# Patient Record
Sex: Female | Born: 1950 | Race: Black or African American | Hispanic: No | Marital: Single | Smoking: Former smoker
Health system: Southern US, Community
[De-identification: ages and names within clinical notes are randomized; demographics above are authoritative.]

## PROBLEM LIST (undated history)

## (undated) DIAGNOSIS — E785 Hyperlipidemia, unspecified: Secondary | ICD-10-CM

## (undated) DIAGNOSIS — I10 Essential (primary) hypertension: Secondary | ICD-10-CM

## (undated) HISTORY — DX: Hyperlipidemia, unspecified: E78.5

## (undated) HISTORY — PX: CATARACT EXTRACTION: SUR2

## (undated) HISTORY — DX: Essential (primary) hypertension: I10

---

## 2000-03-10 ENCOUNTER — Emergency Department (HOSPITAL_COMMUNITY): Admission: EM | Admit: 2000-03-10 | Discharge: 2000-03-10 | Payer: Self-pay | Admitting: Emergency Medicine

## 2001-01-20 ENCOUNTER — Emergency Department (HOSPITAL_COMMUNITY): Admission: EM | Admit: 2001-01-20 | Discharge: 2001-01-20 | Payer: Self-pay | Admitting: Emergency Medicine

## 2002-12-07 ENCOUNTER — Encounter: Payer: Self-pay | Admitting: Allergy and Immunology

## 2002-12-07 ENCOUNTER — Encounter: Admission: RE | Admit: 2002-12-07 | Discharge: 2002-12-07 | Payer: Self-pay | Admitting: *Deleted

## 2007-05-04 ENCOUNTER — Ambulatory Visit: Payer: Self-pay | Admitting: Gastroenterology

## 2008-11-10 ENCOUNTER — Emergency Department: Payer: Self-pay | Admitting: Unknown Physician Specialty

## 2008-11-15 ENCOUNTER — Emergency Department: Payer: Self-pay | Admitting: Internal Medicine

## 2009-12-18 DIAGNOSIS — J309 Allergic rhinitis, unspecified: Secondary | ICD-10-CM | POA: Insufficient documentation

## 2010-08-14 IMAGING — US US EXTREM LOW VENOUS BILAT
1 series · 17 of 24 positions shown · non-contrast
Comparison: none

REASON FOR EXAM: leg pain
COMMENTS:

[Series 1: us extrem low venous bilat · 17 of 42 slices shown]
[im 1/42]
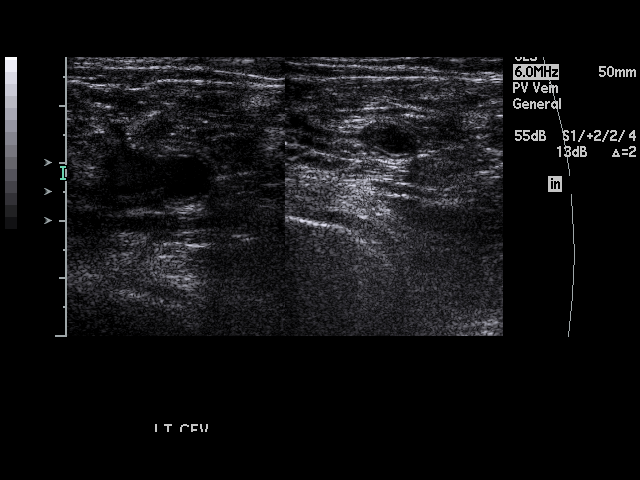
[im 4/42]
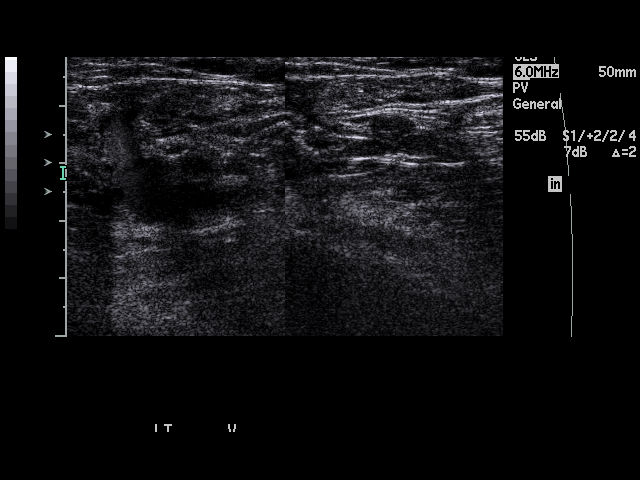
[im 6/42]
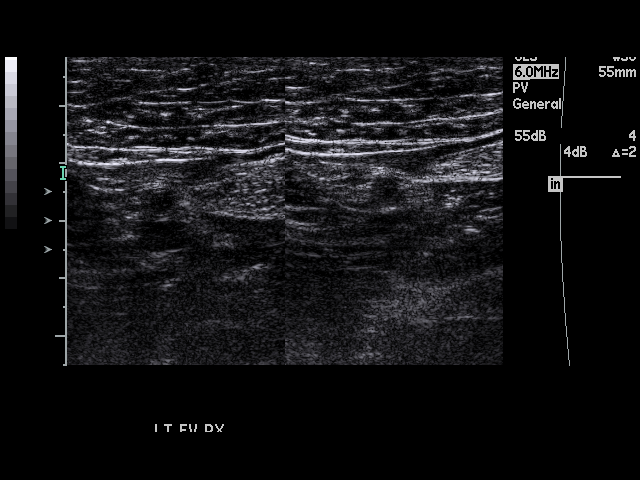
[im 8/42]
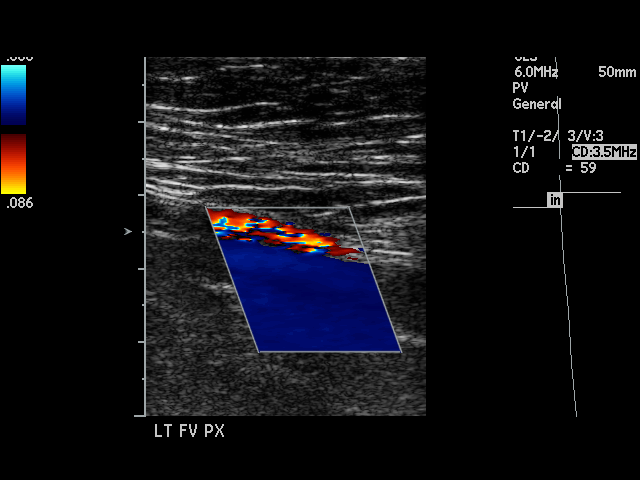
[im 11/42]
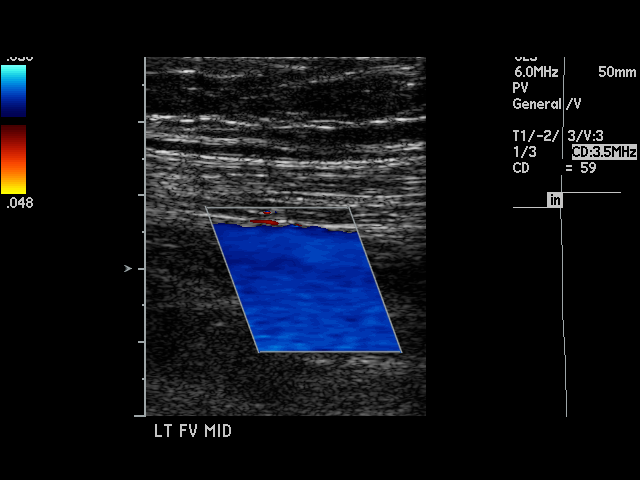
[im 13/42]
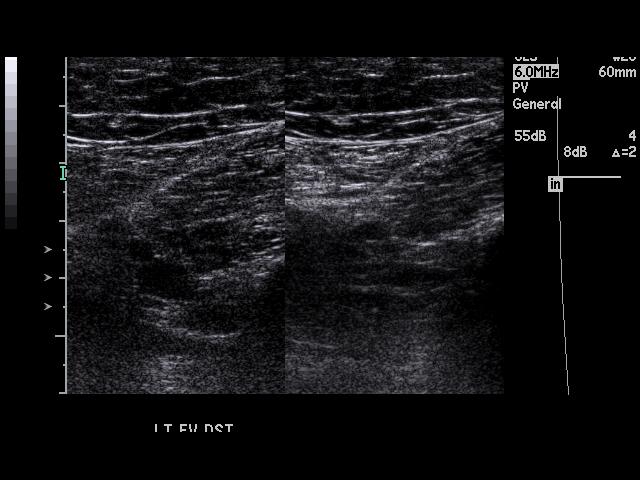
[im 17/42]
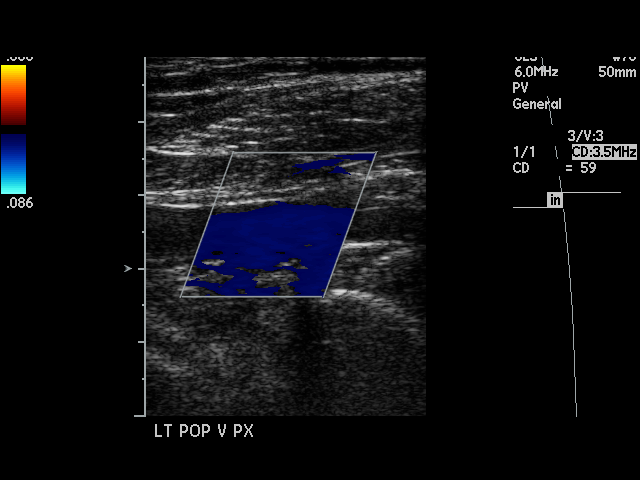
[im 18/42]
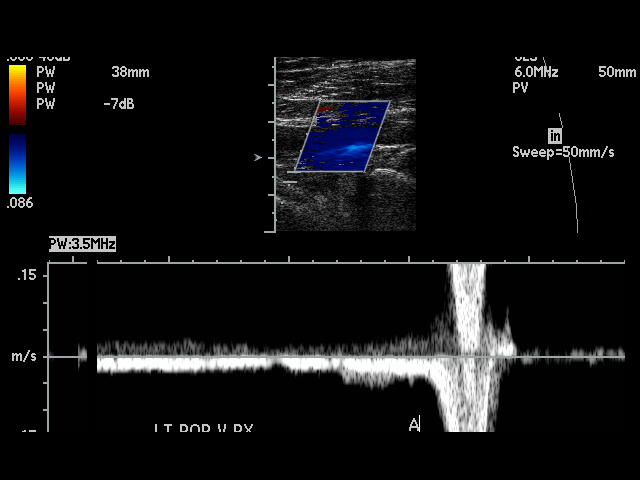
[im 22/42]
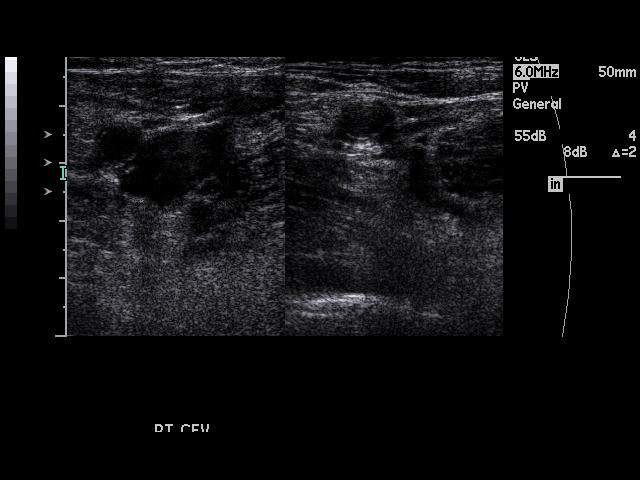
[im 24/42]
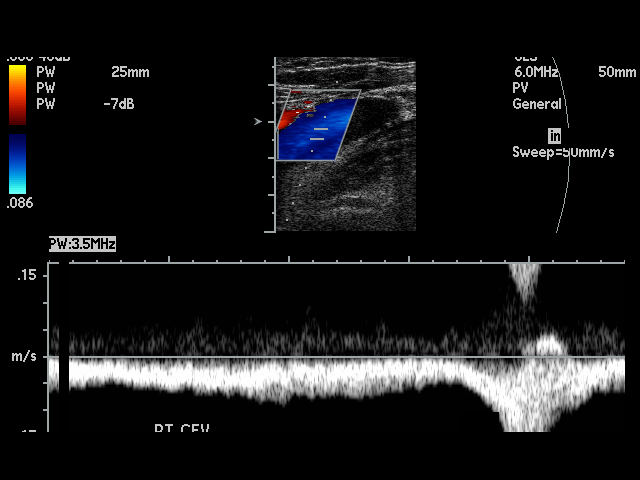
[im 25/42]
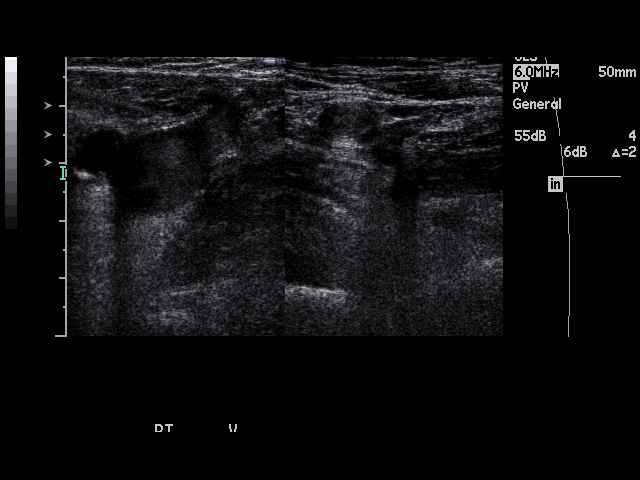
[im 29/42]
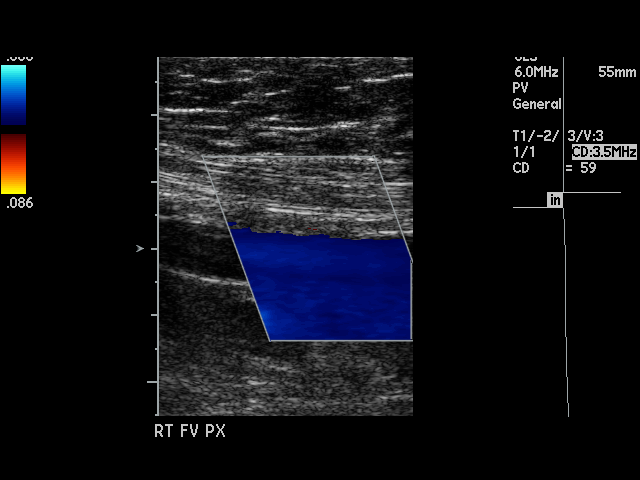
[im 31/42]
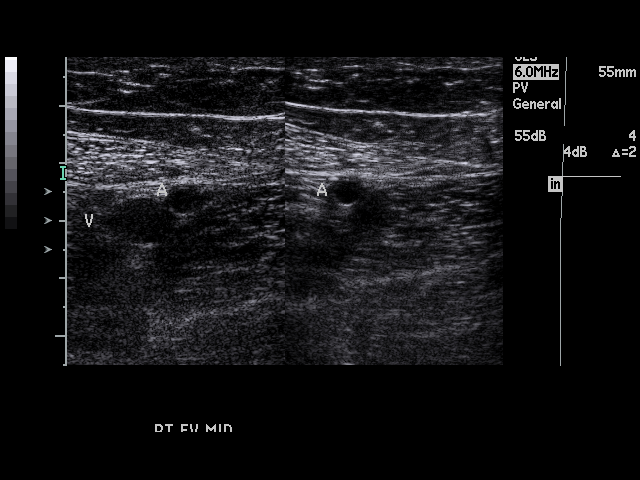
[im 34/42]
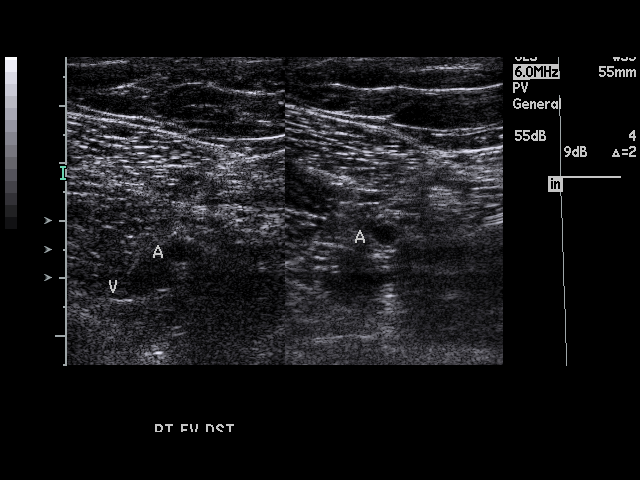
[im 36/42]
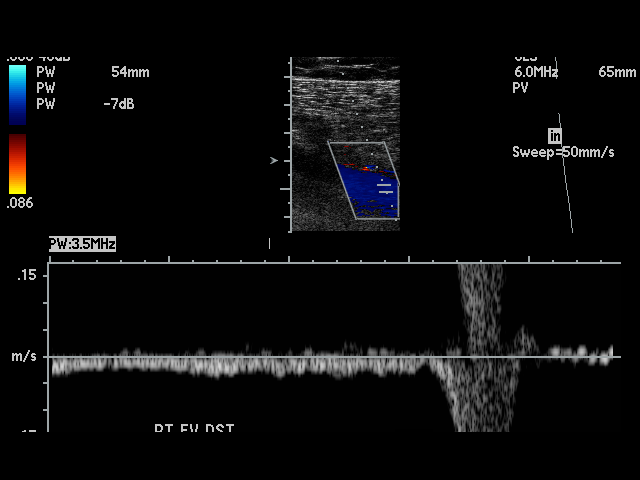
[im 38/42]
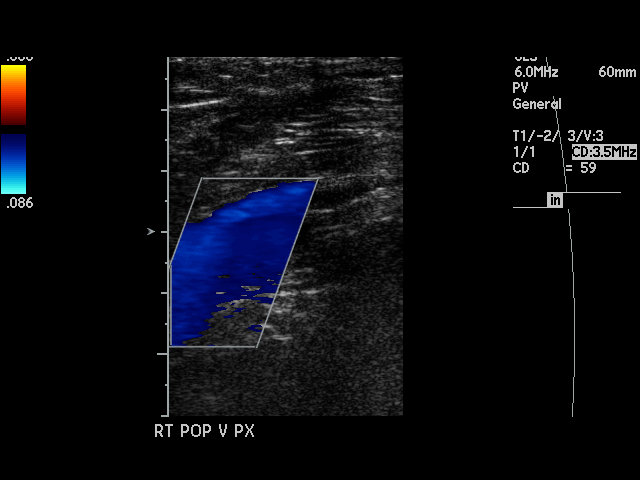
[im 42/42]
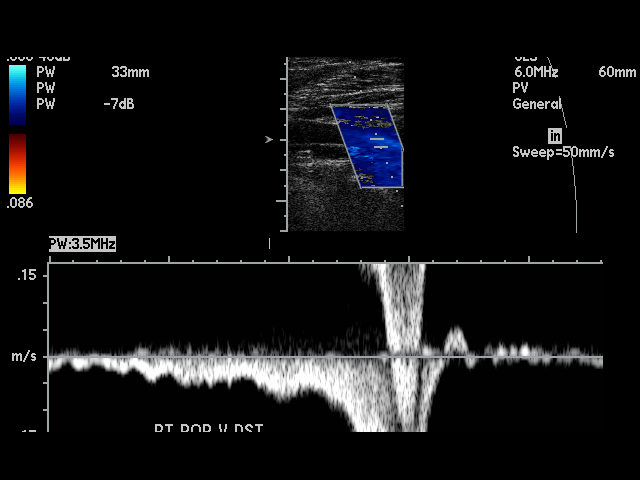

[17 of 24 positions shown; findings below may reference images not displayed]

PROCEDURE:     US  - US DOPPLER LOW EXTR BILATERAL  - November 15, 2008  [DATE]

RESULT:     The augmentation flow waveforms are normal in appearance
bilaterally. The femoral and popliteal veins bilaterally show complete
compressibility throughout their course. Bilateral Doppler examination shows
no occlusion or evidence of deep vein thrombosis.
IMPRESSION: No deep vein thrombosis is identified on either side.

## 2011-02-12 ENCOUNTER — Telehealth: Payer: Self-pay | Admitting: Internal Medicine

## 2011-02-14 NOTE — Telephone Encounter (Signed)
Opened in error

## 2012-02-29 DIAGNOSIS — E7800 Pure hypercholesterolemia, unspecified: Secondary | ICD-10-CM | POA: Insufficient documentation

## 2012-10-31 ENCOUNTER — Emergency Department: Payer: Self-pay | Admitting: Unknown Physician Specialty

## 2012-10-31 LAB — MAGNESIUM: Magnesium: 2 mg/dL

## 2012-10-31 LAB — CBC
HCT: 34.9 % — ABNORMAL LOW (ref 35.0–47.0)
MCH: 28 pg (ref 26.0–34.0)
MCHC: 34.9 g/dL (ref 32.0–36.0)
MCV: 80 fL (ref 80–100)
RBC: 4.34 10*6/uL (ref 3.80–5.20)
RDW: 15.1 % — ABNORMAL HIGH (ref 11.5–14.5)
WBC: 5.1 10*3/uL (ref 3.6–11.0)

## 2012-10-31 LAB — COMPREHENSIVE METABOLIC PANEL
Albumin: 3.8 g/dL (ref 3.4–5.0)
Anion Gap: 8 (ref 7–16)
BUN: 9 mg/dL (ref 7–18)
Co2: 26 mmol/L (ref 21–32)
Creatinine: 0.87 mg/dL (ref 0.60–1.30)
Potassium: 3.5 mmol/L (ref 3.5–5.1)
SGOT(AST): 16 U/L (ref 15–37)
Total Protein: 7.8 g/dL (ref 6.4–8.2)

## 2012-10-31 LAB — PROTIME-INR: INR: 1

## 2012-10-31 LAB — APTT: Activated PTT: 33.8 secs (ref 23.6–35.9)

## 2013-04-10 DIAGNOSIS — Z87891 Personal history of nicotine dependence: Secondary | ICD-10-CM | POA: Insufficient documentation

## 2014-01-06 LAB — HM HEPATITIS C SCREENING LAB: HM Hepatitis Screen: NEGATIVE

## 2014-01-06 LAB — HM HIV SCREENING LAB: HM HIV Screening: NEGATIVE

## 2015-10-10 DIAGNOSIS — G8929 Other chronic pain: Secondary | ICD-10-CM | POA: Insufficient documentation

## 2019-10-08 LAB — HM DEXA SCAN: HM Dexa Scan: NORMAL

## 2019-10-19 ENCOUNTER — Ambulatory Visit (INDEPENDENT_AMBULATORY_CARE_PROVIDER_SITE_OTHER): Payer: Medicare Other

## 2019-10-19 ENCOUNTER — Ambulatory Visit: Payer: Medicare Other | Admitting: Podiatry

## 2019-10-19 ENCOUNTER — Other Ambulatory Visit: Payer: Self-pay

## 2019-10-19 DIAGNOSIS — L989 Disorder of the skin and subcutaneous tissue, unspecified: Secondary | ICD-10-CM

## 2019-10-19 DIAGNOSIS — M25571 Pain in right ankle and joints of right foot: Secondary | ICD-10-CM

## 2019-10-19 DIAGNOSIS — Q828 Other specified congenital malformations of skin: Secondary | ICD-10-CM | POA: Diagnosis not present

## 2019-10-20 ENCOUNTER — Encounter: Payer: Self-pay | Admitting: Podiatry

## 2019-10-20 NOTE — Progress Notes (Addendum)
Subjective:  Patient ID: Hannah Barnett, female    DOB: [birth year],  MRN: [id number]  Chief Complaint  Patient presents with  . Ankle Pain    pt is here for right ankle pain, as well as left foot pain, pt states that she is looking to have both feet evaluated.    69 y.o. female presents with the above complaint.  Patient presents with complaint of bilateral hyperkeratotic lesions due to biomechanical imbalances to bilateral hallux IPJ as well as left subfifth metatarsal benign skin lesion.  Patient states that they have been very painful.  Patient has a flatfoot structure that seems to be the primary cause of it.  She states been going for 6 months pain is often varies.  Painful when walking painful when this first thing in the morning.  She wears comfortable seek sneakers but has not made any shoe gear modifications.  She denies any other acute complaints have been developing for past 10 years and has progressively gotten worse.  She would like to know whether discuss treatment options.  She has not seen anyone else prior to seeing me.  She denies any other acute complaints.   Review of Systems: Negative except as noted in the HPI. Denies N/V/F/Ch.  History reviewed. No pertinent past medical history.  Current Outpatient Medications:  .  aspirin 81 MG EC tablet, Take by mouth., Disp: , Rfl:  .  atorvastatin (LIPITOR) 40 MG tablet, Take 40 mg by mouth daily., Disp: , Rfl:  .  Cholecalciferol 50 MCG (2000 UT) CAPS, Take by mouth., Disp: , Rfl:  .  fluticasone (FLONASE) 50 MCG/ACT nasal spray, 1 spray by Each Nare route daily., Disp: , Rfl:  .  Multiple Vitamin (DAILY VITAMINS) tablet, Take 1 tablet by mouth daily., Disp: , Rfl:  .  nicotine (NICODERM CQ - DOSED IN MG/24 HOURS) 21 mg/24hr patch, Place onto the skin., Disp: , Rfl:  .  vitamin B-12 (CYANOCOBALAMIN) 500 MCG tablet, Take by mouth., Disp: , Rfl:   Social History   Tobacco Use  Smoking Status Not on file    Allergies    Allergen Reactions  . Atorvastatin Other (See Comments)    Severe myalgia   Objective:  There were no vitals filed for this visit. There is no height or weight on file to calculate BMI. Constitutional Well developed. Well nourished.  Vascular Dorsalis pedis pulses palpable bilaterally. Posterior tibial pulses palpable bilaterally. Capillary refill normal to all digits.  No cyanosis or clubbing noted. Pedal hair growth normal.  Neurologic Normal speech. Oriented to person, place, and time. Epicritic sensation to light touch grossly present bilaterally.  Dermatologic Nails well groomed and normal in appearance. No open wounds.  Orthopedic:  Hyperkeratotic lesion noted at hallux IPJ bilateral as well as left submetatarsal 5 porokeratosis with central nucleated core noted.  Upon debridement of the lesion no pinpoint bleeding noted.  Gait examination shows severe pes planovalgus foot structure with calcaneal eversion to many toe sign and unable to recreate the arch with dorsiflexion of the hallux.   Radiographs: 3 views of skeletally mature adult bilateral feet: There is decreasing calcaneal inclination angle mild anterior break in the cyma line there is increase in talar declination angle first ray elevatus noted.  No bunion deformities identified ankle mortise within normal limits.  No osseous fractures noted. Assessment:   1. Right ankle pain, unspecified chronicity   2. Porokeratosis   3. Benign skin lesion    Plan:  Patient was evaluated  and treated and all questions answered.  Bilateral benign skin lesion/porokeratosis at hallux IPJ and left submetatarsal 5 -I explained the patient the etiology of porokeratosis of wrist treatment options were extensively discussed with the patient.  Primarily the reason why she is having a lot of pain associated with it is due to biomechanical imbalances with underlying pes planovalgus deformity.  I discussed with the patient extensive details.  I  believe patient will benefit from aggressive debridement of the lesion with excision of the central nucleated core from left submetatarsal 5.  Patient states understanding like to proceed with the debridement. -Using chisel blade on handle, hyperkeratotic lesion/benign skin lesion were aggressively debrided down to healthy striated tissue.  Central nucleated core excised at left submetatarsal 5.  No complications noted.  No pinpoint bleeding noted.  Pain relief upon debridement.   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  Ultimately the reason why she is developing the above lesions is due to the biomechanical imbalances.  I believe patient will benefit from offloading these lesions as well as obtaining custom-made orthotics to help control the hindfoot motion as well as take the pressure off the arch. -Should be scheduled see rick for custom-made orthotics.   Return in about 3 months (around 01/19/2020) for See Raiford Noble for orthotics ASAP.

## 2019-11-10 ENCOUNTER — Other Ambulatory Visit: Payer: Self-pay

## 2019-11-10 ENCOUNTER — Ambulatory Visit: Payer: Medicare Other | Admitting: Orthotics

## 2019-11-10 DIAGNOSIS — Q828 Other specified congenital malformations of skin: Secondary | ICD-10-CM

## 2019-11-10 DIAGNOSIS — M25571 Pain in right ankle and joints of right foot: Secondary | ICD-10-CM

## 2019-11-11 ENCOUNTER — Other Ambulatory Visit: Payer: Self-pay | Admitting: Podiatry

## 2019-11-11 DIAGNOSIS — L989 Disorder of the skin and subcutaneous tissue, unspecified: Secondary | ICD-10-CM

## 2019-12-03 DIAGNOSIS — I10 Essential (primary) hypertension: Secondary | ICD-10-CM | POA: Insufficient documentation

## 2019-12-14 ENCOUNTER — Telehealth: Payer: Self-pay | Admitting: Podiatry

## 2019-12-14 NOTE — Telephone Encounter (Signed)
Pt left message  @ 233pm upset because she has been trying to get someone all day. She said someone left message that her things(I am guessing orthotics) are in she can pick them up and she lives in [place] and is not driving all the way up there until she talks with someone. Can someone please call her back asap.

## 2019-12-15 NOTE — Telephone Encounter (Signed)
Pt called today, extremely upset. Pt has left multiple messages with the [facility] office. Pt stated she did receive a call, but once she called back no one would answer the phone. Pt wanted to know the status of her orthotics are they ready for pick up and what taking them so long to arrive. Please advise.

## 2019-12-15 NOTE — Progress Notes (Signed)
Patient cast today for CMFO to address RT ankle pain, keratomas

## 2019-12-16 NOTE — Telephone Encounter (Signed)
Patient has been notified via voice mail that orthotics are in [place] office and she can pick up anytime during office hours.

## 2019-12-17 ENCOUNTER — Telehealth: Payer: Self-pay | Admitting: Podiatry

## 2019-12-17 NOTE — Telephone Encounter (Signed)
Pt left message @ 1207 today asking what she needed to bring to pick up her orthotics.   I returned the call and we discussed what to bring. I recommended tennis shoes and also recommended she have an appt with Raiford Noble to pick up since she is traveling to come get them so far. I did say that she could of picked them up but if any issues with them she would have to schedule an appt to see Raiford Noble.. I explained that especially since she has never had them that Raiford Noble needs to see her to make sure they fit in her shoes and fit her feet properly and if not he can adjust if needed at this appt. Pt was very appreciative of the call.

## 2019-12-28 ENCOUNTER — Telehealth: Payer: Self-pay | Admitting: Podiatry

## 2019-12-28 NOTE — Telephone Encounter (Signed)
Pt left message Friday 9.3 @ 244pm with her name and stated she has an appt 9.8 but then the phone disconnected.   I called back and pt did not answer. She is scheduled to be seen Wednesday in [place] to puo.

## 2019-12-29 ENCOUNTER — Encounter: Payer: Medicare Other | Admitting: Orthotics

## 2020-01-20 ENCOUNTER — Ambulatory Visit (INDEPENDENT_AMBULATORY_CARE_PROVIDER_SITE_OTHER): Payer: Medicare Other | Admitting: Podiatry

## 2020-01-20 ENCOUNTER — Encounter: Payer: Self-pay | Admitting: Podiatry

## 2020-01-20 ENCOUNTER — Other Ambulatory Visit: Payer: Self-pay

## 2020-01-20 DIAGNOSIS — Q666 Other congenital valgus deformities of feet: Secondary | ICD-10-CM

## 2020-01-20 DIAGNOSIS — Q828 Other specified congenital malformations of skin: Secondary | ICD-10-CM | POA: Diagnosis not present

## 2020-01-20 NOTE — Patient Instructions (Signed)

## 2020-01-20 NOTE — Progress Notes (Signed)
Subjective:  Patient ID: Hannah Barnett, female    DOB: 01-28-1951,  MRN: [id number]  Chief Complaint  Patient presents with  . Callouses    "the thick skin is hurting my feet again"   She is also here to pick up orthotics  . Foot Orthotics    69 y.o. female presents with the above complaint.  Patient presents with follow-up of bilateral hyperkeratotic lesion due to mechanical bows to the hallux IPJ as well as left submetatarsal 5.  Patient states that there is still continues to be painful.  She has not been able to appropriately offload on.  She is on her foot during work 3 times a week.  The splint is for the most painful.  She denies any other acute complaints.  She would like to discuss other treatment options.  She is here to pick up orthotics today which were dispensed.  Review of Systems: Negative except as noted in the HPI. Denies N/V/F/Ch.  No past medical history on file.  Current Outpatient Medications:  .  aspirin 81 MG EC tablet, Take by mouth., Disp: , Rfl:  .  atorvastatin (LIPITOR) 40 MG tablet, Take 40 mg by mouth daily., Disp: , Rfl:  .  Cholecalciferol 50 MCG (2000 UT) CAPS, Take by mouth., Disp: , Rfl:  .  fluticasone (FLONASE) 50 MCG/ACT nasal spray, 1 spray by Each Nare route daily., Disp: , Rfl:  .  Multiple Vitamin (DAILY VITAMINS) tablet, Take 1 tablet by mouth daily., Disp: , Rfl:  .  nicotine (NICODERM CQ - DOSED IN MG/24 HOURS) 21 mg/24hr patch, Place onto the skin., Disp: , Rfl:  .  vitamin B-12 (CYANOCOBALAMIN) 500 MCG tablet, Take by mouth., Disp: , Rfl:   Social History   Tobacco Use  Smoking Status Not on file    Allergies  Allergen Reactions  . Atorvastatin Other (See Comments)    Severe myalgia   Objective:  There were no vitals filed for this visit. There is no height or weight on file to calculate BMI. Constitutional Well developed. Well nourished.  Vascular Dorsalis pedis pulses palpable bilaterally. Posterior tibial pulses palpable  bilaterally. Capillary refill normal to all digits.  No cyanosis or clubbing noted. Pedal hair growth normal.  Neurologic Normal speech. Oriented to person, place, and time. Epicritic sensation to light touch grossly present bilaterally.  Dermatologic Nails well groomed and normal in appearance. No open wounds.  Orthopedic:  Hyperkeratotic lesion noted at hallux IPJ bilateral as well as left submetatarsal 5 porokeratosis with central nucleated core noted.  Upon debridement of the lesion no pinpoint bleeding noted.  Gait examination shows severe pes planovalgus foot structure with calcaneal eversion to many toe sign and unable to recreate the arch with dorsiflexion of the hallux.   Radiographs: 3 views of skeletally mature adult bilateral feet: There is decreasing calcaneal inclination angle mild anterior break in the cyma line there is increase in talar declination angle first ray elevatus noted.  No bunion deformities identified ankle mortise within normal limits.  No osseous fractures noted. Assessment:   1. Porokeratosis   2. Pes planovalgus    Plan:  Patient was evaluated and treated and all questions answered.  Bilateral benign skin lesion/porokeratosis at hallux IPJ and left submetatarsal 5 -I explained the patient the etiology of porokeratosis of wrist treatment options were extensively discussed with the patient.  Primarily the reason why she is having a lot of pain associated with it is due to biomechanical imbalances with underlying  pes planovalgus deformity.  I discussed with the patient extensive details.  I believe patient will benefit from continued aggressive debridement of the lesion with excision of the central nucleated core from left submetatarsal 5.  Patient states understanding like to proceed with the debridement. -Using chisel blade on handle, hyperkeratotic lesion/benign skin lesion were aggressively debrided down to healthy striated tissue.  Central nucleated core  excised at left submetatarsal 5.  No complications noted.  No pinpoint bleeding noted.  Pain relief upon debridement.   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  Ultimately the reason why she is developing the above lesions is due to the biomechanical imbalances.  I believe patient will benefit from offloading these lesions as well as obtaining custom-made orthotics to help control the hindfoot motion as well as take the pressure off the arch. -Orthotics were picked up today.  They seem to be functioning well.  I encouraged her to continue utilizing it does take the stress off of the underlying porokeratosis.  Patient states understanding   No follow-ups on file.

## 2020-01-20 NOTE — Progress Notes (Signed)
Patient presents for orthotic pick up.  Verbal and written break in and wear instructions given.  Patient will follow up in 4 weeks with Dr if symptoms worsen or fail to improve. 

## 2020-02-25 ENCOUNTER — Telehealth: Payer: Self-pay | Admitting: Orthotics

## 2020-02-25 DIAGNOSIS — Q828 Other specified congenital malformations of skin: Secondary | ICD-10-CM

## 2020-02-25 NOTE — Telephone Encounter (Signed)
Returned call she left on Dawn's phone.  Her concern was the f/o causing discomfort.  Left a message advising her to make an adjustment appointment.

## 2020-03-21 ENCOUNTER — Ambulatory Visit: Payer: Medicare Other | Admitting: Podiatry

## 2020-03-22 ENCOUNTER — Other Ambulatory Visit: Payer: Self-pay

## 2020-03-22 ENCOUNTER — Ambulatory Visit: Payer: Medicare Other | Admitting: Orthotics

## 2020-03-22 DIAGNOSIS — Q666 Other congenital valgus deformities of feet: Secondary | ICD-10-CM

## 2020-03-22 DIAGNOSIS — Q828 Other specified congenital malformations of skin: Secondary | ICD-10-CM

## 2020-03-22 NOTE — Progress Notes (Signed)
Sending back to richy to adjust w/ k-wedge and softer device.

## 2020-03-30 ENCOUNTER — Ambulatory Visit: Payer: Medicare Other | Admitting: Podiatry

## 2020-04-12 ENCOUNTER — Other Ambulatory Visit: Payer: Self-pay

## 2020-04-12 ENCOUNTER — Ambulatory Visit: Payer: Medicare Other | Admitting: Orthotics

## 2020-04-12 ENCOUNTER — Encounter: Payer: Self-pay | Admitting: Podiatry

## 2020-04-12 ENCOUNTER — Ambulatory Visit (INDEPENDENT_AMBULATORY_CARE_PROVIDER_SITE_OTHER): Payer: Medicare Other | Admitting: Orthotics

## 2020-04-12 DIAGNOSIS — Q828 Other specified congenital malformations of skin: Secondary | ICD-10-CM

## 2020-04-12 DIAGNOSIS — Q666 Other congenital valgus deformities of feet: Secondary | ICD-10-CM

## 2020-04-12 NOTE — Progress Notes (Signed)
Patient f/o not in; will be mailed to her from [facility]

## 2020-04-26 ENCOUNTER — Ambulatory Visit: Payer: Medicare Other | Admitting: Orthotics

## 2020-05-24 ENCOUNTER — Telehealth: Payer: Self-pay | Admitting: Podiatry

## 2020-05-24 NOTE — Telephone Encounter (Signed)
Pt left voicemail demanding a call back because she got a reminder call that she had an appt in [place] office but was not coming to [facility]. She canceled that appt.  I returned call and she screamed WHAT upon answering the phone.I politely asked her not to scream at me that I was returning a call to her as she asked. I explained that her appt was cxled for the [place] office but that was an automated system from yesterday and did not update that the appt was cxled. She asked if we would call her if we had any appts sooner in [place] and I told her I had put her on the waitlist and if there qwas a cxlation for 2.16 they could call her to see if it worked for her.

## 2020-05-24 NOTE — Telephone Encounter (Signed)
Pt called stating she just got a text about her appt reminder and said she hopes it was not in [place] because she cannot come to [facility] office it is too far.  I offered her appt this afternoon in [place] at 315 but she could not come. She was then got upset because his next available appt was 3.2 in [place]. I told pt I would put her on the waitlist and if we got any cancellations on 2.16 someone would call to see if it would work for pt. She was not happy about having to wait. She stated she was not paying for the orthotics until they are correct. I told pt I would send a message to billing to see if they could put a hold on the account.

## 2020-05-29 ENCOUNTER — Other Ambulatory Visit: Payer: Medicare Other | Admitting: Orthotics

## 2020-06-06 ENCOUNTER — Telehealth: Payer: Self-pay | Admitting: Podiatry

## 2020-06-06 NOTE — Telephone Encounter (Signed)
Pt left message wanting a call back about an appt.   I returned call and left message for pt to call back.

## 2020-06-09 ENCOUNTER — Telehealth: Payer: Self-pay | Admitting: Podiatry

## 2020-06-09 NOTE — Telephone Encounter (Signed)
Pt left message stating she was waiting on a call back about an appt for the orthotics because they are not working for her.   I returned call and left message for pt that I see she does have an appt on 3.2.2022 with Raiford Noble in [place] because the orthotics are not working for her. I asked her to please call if any further questions.

## 2020-06-21 ENCOUNTER — Ambulatory Visit (INDEPENDENT_AMBULATORY_CARE_PROVIDER_SITE_OTHER): Payer: Medicare (Managed Care) | Admitting: Podiatry

## 2020-06-21 ENCOUNTER — Other Ambulatory Visit: Payer: Self-pay

## 2020-06-21 DIAGNOSIS — L989 Disorder of the skin and subcutaneous tissue, unspecified: Secondary | ICD-10-CM

## 2020-06-21 DIAGNOSIS — Q666 Other congenital valgus deformities of feet: Secondary | ICD-10-CM

## 2020-06-21 DIAGNOSIS — M25571 Pain in right ankle and joints of right foot: Secondary | ICD-10-CM

## 2020-06-21 NOTE — Progress Notes (Signed)
Patient presented today to be re-casted for orthotics.  A foam impression was casted for her right and left foot.  Patient is a size 9 to 9 1/2.  Patient stated today that the inserts are not soft enough and there was never any pockets on the left insert and the pain went up to my knees and that the right insert was ok due to having the pockets in the insert.  Patient was informed that it can take up to 4 weeks and we will call patient when the inserts come in.

## 2020-06-22 ENCOUNTER — Telehealth: Payer: Self-pay | Admitting: Podiatry

## 2020-06-22 NOTE — Telephone Encounter (Signed)
Pt called stating she was calling because the orthotics are having to be sent back again to be corrected and was told to call me about putting orthotic charges  on hold until she gets the orthotics back. I explained that the account is in the contested state and that was done last time we talked but that is all I am able to do as far has putting charges on hold. She said she was told by someone to tell me to call billing. I explained we have a billing specialist here and that she is the one that contacts billing dept but that putting in the contested state is all she can do.

## 2020-07-05 ENCOUNTER — Telehealth: Payer: Self-pay | Admitting: Podiatry

## 2020-07-05 NOTE — Telephone Encounter (Signed)
Pt left 2 messages this morning asking about status of her orthotics.   She then called again and I answered and I told pt I would call when they come in.  She is to bring in the old ones when she comes to pick up the new ones.

## 2020-07-19 ENCOUNTER — Other Ambulatory Visit: Payer: Medicare (Managed Care)

## 2020-07-20 ENCOUNTER — Telehealth: Payer: Self-pay | Admitting: Podiatry

## 2020-07-20 NOTE — Telephone Encounter (Signed)
Pt left message yesterday afternoon stating she wanted to know if we still had available appt on 4.6 in [place] office to puo...  I returned call and left message for pt to call to discuss further but I am not sure if I have anyone to dispense the orthotics on 4.6 in  but If interested I could send the orthotics there to be picked up on Monday 4.4 and she could try them and if needs adjustment or anything keep the appt with EJ on 4.13.

## 2020-07-21 ENCOUNTER — Telehealth: Payer: Self-pay | Admitting: Podiatry

## 2020-07-21 NOTE — Telephone Encounter (Signed)
Pt called stating she got my message and she wants to make sure the orthotics are in [place]. I told pt they are there that since she spoke to [place] the other day I thought she told pt orthotics were in [place]. Pt stated her car is in the shop but she should be able to pick them up on Monday.

## 2020-08-02 ENCOUNTER — Other Ambulatory Visit: Payer: Medicare (Managed Care)

## 2020-09-08 ENCOUNTER — Telehealth: Payer: Self-pay | Admitting: Podiatry

## 2020-09-08 NOTE — Telephone Encounter (Signed)
Pt called stating the orthotics are now hurting her right heel. She said she seen a lady and was told they were made wrong that they should have had a blue cover and was to be remade with the blue color.  I offered her an appt in [place] on 6.1 but she cannot do it in the am. I asked if she wanted to see EJ the pedorthist and she did not know who he was and cannot do the morning. I offered her appt on 7.13 in the afternoon with EJ and she said she did not want to wait that long especially since she is in pain. And they are wanting her to pay for the orthotics, Why would she pay when they are not working for pt. She id not happy with them at all. I offered her an appt with the doctor and she refused stating her insurance will not cover an appt.( I believe she is out of network) I also offered her an appt for 6.10 in [place] at 230 and she said who wants to be in [place] at that time of day. She said if she knew this was going to be such a problem she would not have gotten the orthotics to begin with. She finally agreed to get on the schedule for EJ on 6.10 and I put her on the waitlist for earlier in the day or a different day if anything becomes available. She said she was not going to pay any more for the orthotics until they work. I did explain that I was could not guarantee that she would see Misty Stanley as several of our assistance are helping in the orthotic dept. I apologized but gave her several options. This has been going on since last yr.

## 2020-09-13 ENCOUNTER — Telehealth: Payer: Self-pay | Admitting: Podiatry

## 2020-09-14 NOTE — Telephone Encounter (Signed)
"  Those inserts hurt my feet.  I can't wear them.  Something needs to be done."  Can you come in to see Dr. Allena Katz on Tuesday?  "I don't have any insurance and I can't afford to come there."  Do you want me to see if Dr. Allena Katz can prescribe you something?  "Yes, I don't know what to do about this mess.  These things are not working at all."  Where do your feet hurt at?  "They hurt on the bottom.  It's the heel on the right one too."

## 2020-09-19 MED ORDER — MELOXICAM 15 MG PO TABS
15.0000 mg | ORAL_TABLET | Freq: Every day | ORAL | 0 refills | Status: DC
Start: 2020-09-19 — End: 2022-08-15

## 2020-09-19 NOTE — Telephone Encounter (Signed)
Pt is calling back about having some pain meds prescribed for her foot. Please Advise

## 2020-09-19 NOTE — Telephone Encounter (Signed)
I am calling to let you know that Dr. Allena Katz has sent a prescription for Meloxicam to [facility].  He said he's going to cancel the charge for the orthotics.  He said if you need any further assistance, you can see someone else in the practice since you're not pleased with your service from him.  "Okay, thank you."

## 2020-09-19 NOTE — Telephone Encounter (Signed)
"  I still haven't heard anything about my medicine.  I called there last week.  If something don't happen soon, I'm going to sue your ass.  I shouldn't have to pay $400 for something I can't even use!  I'm not ever coming there again!"  I will send him your message.  He's here today.  You should hear something today.  "Okay, thank you!"

## 2020-09-19 NOTE — Addendum Note (Signed)
Addended by: Enedina Finner on: 09/19/2020 10:22 AM   Modules accepted: Orders

## 2020-09-29 ENCOUNTER — Other Ambulatory Visit: Payer: Medicare (Managed Care)

## 2021-03-05 LAB — HM COLONOSCOPY

## 2022-08-07 LAB — HM MAMMOGRAPHY

## 2022-08-15 ENCOUNTER — Ambulatory Visit (INDEPENDENT_AMBULATORY_CARE_PROVIDER_SITE_OTHER): Payer: 59 | Admitting: Plastic Surgery

## 2022-08-15 ENCOUNTER — Encounter: Payer: Self-pay | Admitting: Plastic Surgery

## 2022-08-15 VITALS — BP 117/73 | HR 73 | Ht 63.0 in | Wt 164.6 lb

## 2022-08-15 DIAGNOSIS — M542 Cervicalgia: Secondary | ICD-10-CM | POA: Diagnosis not present

## 2022-08-15 DIAGNOSIS — N62 Hypertrophy of breast: Secondary | ICD-10-CM | POA: Diagnosis not present

## 2022-08-15 DIAGNOSIS — M546 Pain in thoracic spine: Secondary | ICD-10-CM

## 2022-08-15 DIAGNOSIS — Z6829 Body mass index (BMI) 29.0-29.9, adult: Secondary | ICD-10-CM

## 2022-08-15 NOTE — Progress Notes (Signed)
Referring Provider No referring provider defined for this encounter.   CC:  Chief Complaint  Patient presents with   Advice Only      Hannah Barnett is an 72 y.o. female.  HPI: Hannah Barnett 72 year old female who presents today with complaints of upper back and neck pain secondary to the size of her breast.This has been present for most of her life.  She finds it difficult to find bras that fit appropriately due to the asymmetry of her breast and also due to the large size.  She is requesting a breast reduction.  Allergies  Allergen Reactions   Atorvastatin Other (See Comments)    Severe myalgia    Outpatient Encounter Medications as of 08/15/2022  Medication Sig   amLODipine (NORVASC) 2.5 MG tablet Take 2.5 mg by mouth daily.   BESIVANCE 0.6 % SUSP Place 1 drop into the left eye 3 (three) times daily.   Cholecalciferol 50 MCG (2000 UT) CAPS Take by mouth.   prednisoLONE acetate (PRED FORTE) 1 % ophthalmic suspension SMARTSIG:In Eye(s)   PROLENSA 0.07 % SOLN Place 1 drop into the left eye at bedtime.   rosuvastatin (CRESTOR) 10 MG tablet Take 10 mg by mouth at bedtime.   triamcinolone (NASACORT) 55 MCG/ACT AERO nasal inhaler SMARTSIG:Both Nares   [DISCONTINUED] aspirin 81 MG EC tablet Take by mouth.   [DISCONTINUED] atorvastatin (LIPITOR) 40 MG tablet Take 40 mg by mouth daily.   [DISCONTINUED] fluticasone (FLONASE) 50 MCG/ACT nasal spray 1 spray by Each Nare route daily.   [DISCONTINUED] hydrochlorothiazide (HYDRODIURIL) 12.5 MG tablet Take 12.5 mg by mouth daily.   [DISCONTINUED] meloxicam (MOBIC) 15 MG tablet Take 1 tablet (15 mg total) by mouth daily.   [DISCONTINUED] Multiple Vitamin (DAILY VITAMINS) tablet Take 1 tablet by mouth daily.   [DISCONTINUED] nicotine (NICODERM CQ - DOSED IN MG/24 HOURS) 21 mg/24hr patch Place onto the skin.   [DISCONTINUED] vitamin B-12 (CYANOCOBALAMIN) 500 MCG tablet Take by mouth.   No facility-administered encounter medications on file as  of 08/15/2022.     No past medical history on file.  No past surgical history on file.  No family history on file.  Social History   Social History Narrative   Not on file     Review of Systems General: Denies fevers, chills, weight loss CV: Denies chest pain, shortness of breath, palpitations Breast: Patient denies any particular breast complaints other than the large size.  She did note a small area of discoloration on the right nipple which she says just recently occurred and seems to be getting smaller.  There is no associated pain or drainage with this.  Physical Exam    08/15/2022    9:43 AM  Vitals with BMI  Height   Weight 164 lbs 10 oz  BMI 29.16  Systolic 117  Diastolic 73  Pulse 73    General:  No acute distress,  Alert and oriented, Non-Toxic, Normal speech and affect Breast: Patient has large breasts with moderate asymmetry.  The left breast is clearly larger and wider than the right breast.  There is a small dark area at the 5 o'clock position of the right areola but this does not seem to be associated with any masses or or other abnormalities.  Physical exam does not reveal any masses.  There are no other nipple abnormalities or nipple discharge.  Sternal notch to nipple distance on the right is 33 cm and fold to nipple distance is 14 cm sternal  notch to nipple distance on the left is 36 cm and 14 cm on the left. Mammogram: Mammogram performed April 2024 is BI-RADS 2 for fibroglandular Assessment/Plan Macromastia: Patient is complaining of upper back and neck pain and shoulder grooving due to her bra straps not fitting appropriately.  She would likely benefit from a bilateral breast reduction.  We discussed the procedure at length including the location of the incisions and the unpredictable nature of scarring.  We discussed the risks of bleeding, infection, seroma formation.  She understands I will use drains postoperatively.  She will also need to wear  compression for 6 weeks.  We discussed the postoperative limitations including no heavy lifting greater than 20 pounds, no vigorous activity, and no submerging the incisions in water.  We also discussed the specific risk of nipple loss due to nipple ischemia.  She understands that there may be some difficulty in interpreting her mammograms in the future after breast reduction.  She understands that there is no way to guarantee any particular cup size.  She also understands that there will be asymmetry of her breast given the asymmetry that she currently has.  All questions were answered to her satisfaction.  Photographs were obtained with her consent was submitted for a bilateral breast reduction.  Estimated weight for removal and will be 400 to 450 g on the right and 500 to 600 g on the left  Hannah Barnett 08/15/2022, 2:42 PM

## 2022-09-11 ENCOUNTER — Ambulatory Visit (INDEPENDENT_AMBULATORY_CARE_PROVIDER_SITE_OTHER): Payer: 59 | Admitting: Internal Medicine

## 2022-09-11 ENCOUNTER — Encounter: Payer: Self-pay | Admitting: Internal Medicine

## 2022-09-11 VITALS — BP 122/64 | HR 85 | Temp 98.2°F | Resp 16 | Ht 61.5 in | Wt 162.9 lb

## 2022-09-11 DIAGNOSIS — E559 Vitamin D deficiency, unspecified: Secondary | ICD-10-CM

## 2022-09-11 DIAGNOSIS — I10 Essential (primary) hypertension: Secondary | ICD-10-CM

## 2022-09-11 DIAGNOSIS — E7800 Pure hypercholesterolemia, unspecified: Secondary | ICD-10-CM | POA: Diagnosis not present

## 2022-09-11 LAB — CBC WITH DIFFERENTIAL/PLATELET
Eosinophils Absolute: 21 cells/uL (ref 15–500)
HCT: 38.4 % (ref 35.0–45.0)
Lymphs Abs: 2502 cells/uL (ref 850–3900)
RBC: 4.8 10*6/uL (ref 3.80–5.10)

## 2022-09-11 MED ORDER — BLOOD PRESSURE MONITOR/L CUFF MISC
1.0000 | Freq: Every day | 0 refills | Status: DC
Start: 2022-09-11 — End: 2022-12-05

## 2022-09-11 MED ORDER — ROSUVASTATIN CALCIUM 10 MG PO TABS
10.0000 mg | ORAL_TABLET | Freq: Every day | ORAL | 1 refills | Status: DC
Start: 2022-09-11 — End: 2023-03-14

## 2022-09-11 MED ORDER — AMLODIPINE BESYLATE 2.5 MG PO TABS: 2.5000 mg | ORAL_TABLET | Freq: Every day | ORAL | 1 refills | Status: DC

## 2022-09-11 NOTE — Progress Notes (Signed)
Established Patient Office Visit  Subjective   Patient ID: Hannah Barnett, female    DOB: [birth year]  Age: 72 y.o. MRN: [id number]  Chief Complaint  Patient presents with   Establish Care    HPI  Patient is here to establish care, had been seen at [facility].   Hypertension: -Medications: Amlodipine 2.5 mg  -Patient is compliant with above medications and reports no side effects. -Checking BP at home (average): 120-140/70-90 -Denies any SOB, CP, vision changes, LE edema or symptoms of hypotension  HLD: -Medications: Crestor 10 mg -Patient is compliant with above medications and reports no side effects.  -Last lipid panel: Lipid Panel - 4/23 TC 184, triglycerides 110, HDL 57, LDL 105   Vitamin D Deficiency:  -Last checked 51.5 4/23 -Currently on supplements 2000 IU daily   Health Maintenance: -Blood work due -Mammogram 4/24 -Colonoscopy 11/22, repeat in 5-7 years -Lung cancer screening 07/01/22 negative - August 2021 -Prevnar 20 due   Patient Active Problem List   Diagnosis Date Noted   Essential hypertension 12/03/2019   Chronic right shoulder pain 10/10/2015   History of tobacco use 04/10/2013   Hypercholesteremia 02/29/2012   Allergic rhinitis 12/18/2009   Past Medical History:  Diagnosis Date   Hyperlipidemia    Hypertension    Past Surgical History:  Procedure Laterality Date   CATARACT EXTRACTION Bilateral    Social History   Tobacco Use   Smoking status: Former    Types: Cigarettes   Smokeless tobacco: Never  Vaping Use   Vaping Use: Never used  Substance Use Topics   Alcohol use: Never   Drug use: Never   Social History   Socioeconomic History   Marital status: Single    Spouse name: Not on file   Number of children: Not on file   Years of education: Not on file   Highest education level: Not on file  Occupational History   Not on file  Tobacco Use   Smoking status: Former    Types: Cigarettes   Smokeless tobacco: Never   Vaping Use   Vaping Use: Never used  Substance and Sexual Activity   Alcohol use: Never   Drug use: Never   Sexual activity: Not Currently  Other Topics Concern   Not on file  Social History Narrative   Not on file   Social Determinants of Health   Financial Resource Strain: Not on file  Food Insecurity: Not on file  Transportation Needs: Not on file  Physical Activity: Not on file  Stress: Not on file  Social Connections: Not on file  Intimate Partner Violence: Not on file   Family Status  Relation Name Status   Mother  Deceased   Father  Deceased   Family History  Problem Relation Age of Onset   Dementia Mother    Cancer Father    Allergies  Allergen Reactions   Atorvastatin Other (See Comments)    Severe myalgia      Review of Systems  All other systems reviewed and are negative.     Objective:     BP 122/64   Pulse 85   Temp 98.2 F (36.8 C)   Resp 16   Ht 5' 1.5" (1.562 m)   Wt 162 lb 14.4 oz (73.9 kg)   SpO2 98%   BMI 30.28 kg/m  BP Readings from Last 3 Encounters:  09/11/22 122/64  08/15/22 117/73   Wt Readings from Last 3 Encounters:  09/11/22 162 lb 14.4  oz (73.9 kg)  08/15/22 164 lb 9.6 oz (74.7 kg)      Physical Exam Constitutional:      Appearance: Normal appearance.  HENT:     Head: Normocephalic and atraumatic.     Mouth/Throat:     Mouth: Mucous membranes are moist.     Pharynx: Oropharynx is clear.  Eyes:     Extraocular Movements: Extraocular movements intact.     Conjunctiva/sclera: Conjunctivae normal.     Pupils: Pupils are equal, round, and reactive to light.  Cardiovascular:     Rate and Rhythm: Normal rate and regular rhythm.  Pulmonary:     Effort: Pulmonary effort is normal.     Breath sounds: Normal breath sounds.  Musculoskeletal:     Right lower leg: No edema.     Left lower leg: No edema.  Skin:    General: Skin is warm and dry.  Neurological:     General: No focal deficit present.     Mental  Status: She is alert. Mental status is at baseline.  Psychiatric:        Mood and Affect: Mood normal.        Behavior: Behavior normal.      Results for orders placed or performed in visit on 09/11/22  HM MAMMOGRAPHY  Result Value Ref Range   HM Mammogram 0-4 Bi-Rad 0-4 Bi-Rad, Self Reported Normal  HM HIV SCREENING LAB  Result Value Ref Range   HM HIV Screening Negative - Validated   HM HEPATITIS C SCREENING LAB  Result Value Ref Range   HM Hepatitis Screen Negative-Validated   HM COLONOSCOPY  Result Value Ref Range   HM Colonoscopy See Report (in chart) See Report (in chart), Patient Reported    Last CBC Lab Results  Component Value Date   WBC 5.1 10/31/2012   HGB 12.2 10/31/2012   HCT 34.9 (L) 10/31/2012   MCV 80 10/31/2012   MCH 28.0 10/31/2012   RDW 15.1 (H) 10/31/2012   PLT 386 10/31/2012   Last metabolic panel Lab Results  Component Value Date   GLUCOSE 94 10/31/2012   NA 141 10/31/2012   K 3.5 10/31/2012   CL 107 10/31/2012   CO2 26 10/31/2012   BUN 9 10/31/2012   CREATININE 0.87 10/31/2012   GFRNONAA >60 10/31/2012   CALCIUM 9.4 10/31/2012   PROT 7.8 10/31/2012   ALBUMIN 3.8 10/31/2012   BILITOT 0.4 10/31/2012   ALKPHOS 89 10/31/2012   AST 16 10/31/2012   ALT 20 10/31/2012   ANIONGAP 8 10/31/2012   Last lipids No results found for: "CHOL", "HDL", "LDLCALC", "LDLDIRECT", "TRIG", "CHOLHDL" Last hemoglobin A1c No results found for: "HGBA1C" Last thyroid functions No results found for: "TSH", "T3TOTAL", "T4TOTAL", "THYROIDAB" Last vitamin D No results found for: "25OHVITD2", "25OHVITD3", "VD25OH" Last vitamin B12 and Folate No results found for: "VITAMINB12", "FOLATE"    The 10-year ASCVD risk score (Arnett DK, et al., 2019) is: 10.2%    Assessment & Plan:   1. Essential hypertension: Controlled, continue Amlodipine 2.5 mg daily, refilled. Labs due. Follow up in 6 months to recheck.   - CBC w/Diff/Platelet - COMPLETE METABOLIC PANEL WITH  GFR - Blood Pressure Monitoring (BLOOD PRESSURE MONITOR/L CUFF) MISC; 1 each by Does not apply route daily.  Dispense: 1 each; Refill: 0 - amLODipine (NORVASC) 2.5 MG tablet; Take 1 tablet (2.5 mg total) by mouth daily.  Dispense: 90 tablet; Refill: 1  2. Hypercholesteremia: Recheck cholesterol today, continue Crestor 10 mg, refilled.  -  Lipid Profile - rosuvastatin (CRESTOR) 10 MG tablet; Take 1 tablet (10 mg total) by mouth at bedtime.  Dispense: 90 tablet; Refill: 1  3. Vitamin D deficiency: Recheck vitamin D levels, currently on supplements 2000 IU daily.   - Vitamin D (25 hydroxy)    Return in 6 months (on 03/14/2023).    Margarita Mail, DO

## 2022-09-11 NOTE — Patient Instructions (Signed)
It was great seeing you today!  Plan discussed at today's visit: -Blood work ordered today, results will be uploaded to MyChart.  -Medications refilled -New blood pressure cuff sent to pharmacy as well -Consider pneumonia vaccine, more info below  Follow up in: 6 months or sooner as needed  Take care and let us know if you have any questions or concerns prior to your next visit.  Dr. Caralee Ates  Pneumococcal Conjugate Vaccine (PCV20) Injection What is this medication? PNEUMOCOCCAL CONJUGATE VACCINE (NEU mo KOK al kon ju gate vak SEEN) reduces the risk of pneumococcal disease, such as pneumonia. It does not treat pneumococcal disease. It is still possible to get pneumococcal disease after receiving this vaccine, but the symptoms may be less severe or not last as long. It works by helping your immune system learn how to fight off a future infection. This medicine may be used for other purposes; ask your health care provider or pharmacist if you have questions. COMMON BRAND NAME(S): Prevnar 20 What should I tell my care team before I take this medication? They need to know if you have any of these conditions: Bleeding disorder Fever Immune system problems An unusual or allergic reaction to pneumococcal vaccine, diphtheria toxoid, other vaccines, other medications, foods, dyes, or preservatives Pregnant or trying to get pregnant Breastfeeding How should I use this medication? This vaccine is injected into a muscle. It is given by your care team. A copy of Vaccine Information Statements will be given before each vaccination. Be sure to read this information carefully each time. This sheet may change often. Talk to your care team about the use of this medication in children. While it may be given to children as young as 6 weeks for selected conditions, precautions do apply. Overdosage: If you think you have taken too much of this medicine contact a poison control center or emergency room at  once. NOTE: This medicine is only for you. Do not share this medicine with others. What if I miss a dose? This does not apply. This medication is not for regular use. What may interact with this medication? Medications for cancer chemotherapy Medications that suppress your immune function Steroid medications, such as prednisone or cortisone This list may not describe all possible interactions. Give your health care provider a list of all the medicines, herbs, non-prescription drugs, or dietary supplements you use. Also tell them if you smoke, drink alcohol, or use illegal drugs. Some items may interact with your medicine. What should I watch for while using this medication? Visit your care team regularly. Report any side effects to your care team right away. This vaccine, like all vaccines, may not fully protect everyone. What side effects may I notice from receiving this medication? Side effects that you should report to your care team as soon as possible: Allergic reactions--skin rash, itching, hives, swelling of the face, lips, tongue, or throat Side effects that usually do not require medical attention (report these to your care team if they continue or are bothersome): Fatigue Fever Headache Joint pain Muscle pain Pain, redness, or irritation at injection site This list may not describe all possible side effects. Call your doctor for medical advice about side effects. You may report side effects to FDA at 1-800-FDA-1088. Where should I keep my medication? This vaccine is only given by your care team. It will not be stored at home. NOTE: This sheet is a summary. It may not cover all possible information. If you have questions about this  medicine, talk to your doctor, pharmacist, or health care provider.  2023 Elsevier/Gold Standard (2021-09-18 00:00:00)

## 2022-09-12 ENCOUNTER — Encounter: Payer: Self-pay | Admitting: Internal Medicine

## 2022-09-12 LAB — CBC WITH DIFFERENTIAL/PLATELET
Absolute Monocytes: 482 cells/uL (ref 200–950)
Basophils Absolute: 42 cells/uL (ref 0–200)
Basophils Relative: 0.8 %
Eosinophils Relative: 0.4 %
Hemoglobin: 12.9 g/dL (ref 11.7–15.5)
MCH: 26.9 pg — ABNORMAL LOW (ref 27.0–33.0)
MCHC: 33.6 g/dL (ref 32.0–36.0)
MCV: 80 fL (ref 80.0–100.0)
MPV: 10.4 fL (ref 7.5–12.5)
Monocytes Relative: 9.1 %
Neutro Abs: 2253 cells/uL (ref 1500–7800)
Neutrophils Relative %: 42.5 %
Platelets: 426 10*3/uL — ABNORMAL HIGH (ref 140–400)
RDW: 15.1 % — ABNORMAL HIGH (ref 11.0–15.0)
Total Lymphocyte: 47.2 %
WBC: 5.3 10*3/uL (ref 3.8–10.8)

## 2022-09-12 LAB — LIPID PANEL
Cholesterol: 192 mg/dL (ref <200)
HDL: 68 mg/dL (ref >=50)
LDL Cholesterol (Calc): 106 mg/dL (calc) — ABNORMAL HIGH
Non-HDL Cholesterol (Calc): 124 mg/dL (calc) (ref <130)
Total CHOL/HDL Ratio: 2.8 (calc) (ref <5.0)
Triglycerides: 87 mg/dL (ref <150)

## 2022-09-12 LAB — COMPLETE METABOLIC PANEL WITH GFR
AG Ratio: 1.6 (calc) (ref 1.0–2.5)
ALT: 15 U/L (ref 6–29)
AST: 15 U/L (ref 10–35)
Albumin: 4.3 g/dL (ref 3.6–5.1)
Alkaline phosphatase (APISO): 86 U/L (ref 37–153)
BUN: 10 mg/dL (ref 7–25)
CO2: 28 mmol/L (ref 20–32)
Calcium: 9.8 mg/dL (ref 8.6–10.4)
Chloride: 105 mmol/L (ref 98–110)
Creat: 0.73 mg/dL (ref 0.60–1.00)
Globulin: 2.7 g/dL (calc) (ref 1.9–3.7)
Glucose, Bld: 86 mg/dL (ref 65–99)
Potassium: 4.7 mmol/L (ref 3.5–5.3)
Sodium: 142 mmol/L (ref 135–146)
Total Bilirubin: 0.4 mg/dL (ref 0.2–1.2)
Total Protein: 7 g/dL (ref 6.1–8.1)
eGFR: 88 mL/min/{1.73_m2} (ref >=60)

## 2022-09-12 LAB — VITAMIN D 25 HYDROXY (VIT D DEFICIENCY, FRACTURES): Vit D, 25-Hydroxy: 43 ng/mL (ref 30–100)

## 2022-09-13 ENCOUNTER — Telehealth: Payer: Self-pay | Admitting: Plastic Surgery

## 2022-09-13 NOTE — Telephone Encounter (Signed)
Pending Ref# [id number] for BIL Breast Reduction

## 2022-09-19 ENCOUNTER — Encounter: Payer: Self-pay | Admitting: Internal Medicine

## 2022-09-23 ENCOUNTER — Encounter: Payer: Self-pay | Admitting: Internal Medicine

## 2022-09-25 NOTE — Telephone Encounter (Signed)
Patient called back and said that she had got a letter saying her breast reduction was approved and she wanted to make sure we were aware so she could move forward with scheduling surgery

## 2022-09-27 ENCOUNTER — Telehealth: Payer: Self-pay | Admitting: Plastic Surgery

## 2022-09-27 NOTE — Telephone Encounter (Signed)
Approved Auth# [id number] good from 5.24.24 to 8.22.24 for BIL Breast Reduction.

## 2022-10-02 ENCOUNTER — Encounter: Payer: Self-pay | Admitting: Internal Medicine

## 2022-10-08 ENCOUNTER — Telehealth: Payer: Self-pay | Admitting: *Deleted

## 2022-10-08 NOTE — Telephone Encounter (Signed)
Request for surgical clearance sent to pt's PCP Dr. Margarita Mail (fax [phone]) with fax confirmation received

## 2022-10-16 NOTE — H&P (View-Only) (Signed)
 Patient ID: Hannah Barnett, female    DOB: Nov 24, 1950, 72 y.o.   MRN: [id number]  No chief complaint on file.   No diagnosis found.   History of Present Illness: Hannah Barnett is a 72 y.o.  female  with a history of macromastia.  She presents for preoperative evaluation for upcoming procedure, Bilateral Breast Reduction, scheduled for 11/05/2022 with Dr.  Ladona Ridgel  The patient {HAS HAS [id number]} had problems with anesthesia. ***  Summary of Previous Visit: Patient was seen for initial consult by Dr. Ladona Ridgel on 08/15/2022.  At this visit, patient was complaining of upper back and neck pain secondary to the size of her breasts.  Patient reported that this had been present for most of her life.  She stated that she found it difficult to find bras that fit appropriately to the asymmetry of her breasts as well as is the large size.  Patient was requesting a bilateral breast reduction.  On exam, patient was noted to have large breasts with moderate asymmetry.  The left breast was clearly larger than the right breast.  The STN on the right was 33 cm and the STN on the left was 36 cm.  Patient was found to most likely benefit from a bilateral breast reduction.  Plan was to submit for bilateral breast reduction.  Estimated excess breast tissue to be removed at time of surgery: 400-450 grams on the right, 500 to 600 g on the left  Job: ***  PMH Significant for: Macromastia, hypertension   Past Medical History: Allergies: Allergies  Allergen Reactions   Atorvastatin Other (See Comments)    Severe myalgia    Current Medications:  Current Outpatient Medications:    amLODipine (NORVASC) 2.5 MG tablet, Take 1 tablet (2.5 mg total) by mouth daily., Disp: 90 tablet, Rfl: 1   Blood Pressure Monitoring (BLOOD PRESSURE MONITOR/L CUFF) MISC, 1 each by Does not apply route daily., Disp: 1 each, Rfl: 0   Cholecalciferol 50 MCG (2000 UT) CAPS, Take by mouth., Disp: , Rfl:    rosuvastatin (CRESTOR) 10  MG tablet, Take 1 tablet (10 mg total) by mouth at bedtime., Disp: 90 tablet, Rfl: 1   triamcinolone (NASACORT) 55 MCG/ACT AERO nasal inhaler, SMARTSIG:Both Nares, Disp: , Rfl:   Past Medical Problems: Past Medical History:  Diagnosis Date   Hyperlipidemia    Hypertension     Past Surgical History: Past Surgical History:  Procedure Laterality Date   CATARACT EXTRACTION Bilateral     Social History: Social History   Socioeconomic History   Marital status: Single    Spouse name: Not on file   Number of children: Not on file   Years of education: Not on file   Highest education level: Not on file  Occupational History   Not on file  Tobacco Use   Smoking status: Former    Types: Cigarettes   Smokeless tobacco: Never  Vaping Use   Vaping Use: Never used  Substance and Sexual Activity   Alcohol use: Never   Drug use: Never   Sexual activity: Not Currently  Other Topics Concern   Not on file  Social History Narrative   Not on file   Social Determinants of Health   Financial Resource Strain: Not on file  Food Insecurity: Not on file  Transportation Needs: Not on file  Physical Activity: Not on file  Stress: Not on file  Social Connections: Not on file  Intimate Partner Violence: Not on file  Family History: Family History  Problem Relation Age of Onset   Dementia Mother    Cancer Father     Review of Systems: ROS  Physical Exam: Vital Signs There were no vitals taken for this visit.  Physical Exam *** Constitutional:      General: Not in acute distress.    Appearance: Normal appearance. Not ill-appearing.  HENT:     Head: Normocephalic and atraumatic.  Eyes:     Pupils: Pupils are equal, round Neck:     Musculoskeletal: Normal range of motion.  Cardiovascular:     Rate and Rhythm: Normal rate    Pulses: Normal pulses.  Pulmonary:     Effort: Pulmonary effort is normal. No respiratory distress.  Musculoskeletal: Normal range of motion.   Skin:    General: Skin is warm and dry.     Findings: No erythema or rash.  Neurological:     General: No focal deficit present.     Mental Status: Alert and oriented to person, place, and time. Mental status is at baseline.     Motor: No weakness.  Psychiatric:        Mood and Affect: Mood normal.        Behavior: Behavior normal.    Assessment/Plan: The patient is scheduled for bilateral breast reduction with Dr. {[id number]::"HYQMVH","QIONGEXBMW"}.  Risks, benefits, and alternatives of procedure discussed, questions answered and consent obtained.    Smoking Status: ***; Counseling Given? *** Last Mammogram: ***; Results: ***  Caprini Score: ***; Risk Factors include: ***, BMI *** 25, and length of planned surgery. Recommendation for mechanical *** pharmacological prophylaxis. Encourage early ambulation.   Pictures obtained: @consult ***  Post-op Rx sent to pharmacy: {Blank:[id number]::"Oxycodone, Zofran, Keflex","Oxycodone, Zofran"}  Patient was provided with the breast reduction and General Surgical Risk consent document and Pain Medication Agreement prior to their appointment.  They had adequate time to read through the risk consent documents and Pain Medication Agreement. We also discussed them in person together during this preop appointment. All of their questions were answered to their satisfaction.  Recommended calling if they have any further questions.  Risk consent form and Pain Medication Agreement to be scanned into patient's chart.  The risk that can be encountered with breast reduction were discussed and include the following but not limited to these:  Breast asymmetry, fluid accumulation, firmness of the breast, inability to breast feed, loss of nipple or areola, skin loss, decrease or no nipple sensation, fat necrosis of the breast tissue, bleeding, infection, healing delay.  There are risks of anesthesia, changes to skin sensation and injury to nerves or blood vessels.  The  muscle can be temporarily or permanently injured.  You may have an allergic reaction to tape, suture, glue, blood products which can result in skin discoloration, swelling, pain, skin lesions, poor healing.  Any of these can lead to the need for revisonal surgery or stage procedures.  A reduction has potential to interfere with diagnostic procedures.  Nipple or breast piercing can increase risks of infection.  This procedure is best done when the breast is fully developed.  Changes in the breast will continue to occur over time.  Pregnancy can alter the outcomes of previous breast reduction surgery, weight gain and weigh loss can also effect the long term appearance.     Electronically signed by: Laurena Spies, PA-C 10/16/2022 8:39 AM

## 2022-10-16 NOTE — Progress Notes (Unsigned)
Patient ID: Hannah Barnett, female    DOB: Nov 24, 1950, 72 y.o.   MRN: [id number]  No chief complaint on file.   No diagnosis found.   History of Present Illness: Hannah Barnett is a 72 y.o.  female  with a history of macromastia.  She presents for preoperative evaluation for upcoming procedure, Bilateral Breast Reduction, scheduled for 11/05/2022 with Dr.  Ladona Ridgel  The patient {HAS HAS VHQ:46962} had problems with anesthesia. ***  Summary of Previous Visit: Patient was seen for initial consult by Dr. Ladona Ridgel on 08/15/2022.  At this visit, patient was complaining of upper back and neck pain secondary to the size of her breasts.  Patient reported that this had been present for most of her life.  She stated that she found it difficult to find bras that fit appropriately to the asymmetry of her breasts as well as is the large size.  Patient was requesting a bilateral breast reduction.  On exam, patient was noted to have large breasts with moderate asymmetry.  The left breast was clearly larger than the right breast.  The STN on the right was 33 cm and the STN on the left was 36 cm.  Patient was found to most likely benefit from a bilateral breast reduction.  Plan was to submit for bilateral breast reduction.  Estimated excess breast tissue to be removed at time of surgery: 400-450 grams on the right, 500 to 600 g on the left  Job: ***  PMH Significant for: Macromastia, hypertension   Past Medical History: Allergies: Allergies  Allergen Reactions   Atorvastatin Other (See Comments)    Severe myalgia    Current Medications:  Current Outpatient Medications:    amLODipine (NORVASC) 2.5 MG tablet, Take 1 tablet (2.5 mg total) by mouth daily., Disp: 90 tablet, Rfl: 1   Blood Pressure Monitoring (BLOOD PRESSURE MONITOR/L CUFF) MISC, 1 each by Does not apply route daily., Disp: 1 each, Rfl: 0   Cholecalciferol 50 MCG (2000 UT) CAPS, Take by mouth., Disp: , Rfl:    rosuvastatin (CRESTOR) 10  MG tablet, Take 1 tablet (10 mg total) by mouth at bedtime., Disp: 90 tablet, Rfl: 1   triamcinolone (NASACORT) 55 MCG/ACT AERO nasal inhaler, SMARTSIG:Both Nares, Disp: , Rfl:   Past Medical Problems: Past Medical History:  Diagnosis Date   Hyperlipidemia    Hypertension     Past Surgical History: Past Surgical History:  Procedure Laterality Date   CATARACT EXTRACTION Bilateral     Social History: Social History   Socioeconomic History   Marital status: Single    Spouse name: Not on file   Number of children: Not on file   Years of education: Not on file   Highest education level: Not on file  Occupational History   Not on file  Tobacco Use   Smoking status: Former    Types: Cigarettes   Smokeless tobacco: Never  Vaping Use   Vaping Use: Never used  Substance and Sexual Activity   Alcohol use: Never   Drug use: Never   Sexual activity: Not Currently  Other Topics Concern   Not on file  Social History Narrative   Not on file   Social Determinants of Health   Financial Resource Strain: Not on file  Food Insecurity: Not on file  Transportation Needs: Not on file  Physical Activity: Not on file  Stress: Not on file  Social Connections: Not on file  Intimate Partner Violence: Not on file  Family History: Family History  Problem Relation Age of Onset   Dementia Mother    Cancer Father     Review of Systems: ROS  Physical Exam: Vital Signs There were no vitals taken for this visit.  Physical Exam *** Constitutional:      General: Not in acute distress.    Appearance: Normal appearance. Not ill-appearing.  HENT:     Head: Normocephalic and atraumatic.  Eyes:     Pupils: Pupils are equal, round Neck:     Musculoskeletal: Normal range of motion.  Cardiovascular:     Rate and Rhythm: Normal rate    Pulses: Normal pulses.  Pulmonary:     Effort: Pulmonary effort is normal. No respiratory distress.  Musculoskeletal: Normal range of motion.   Skin:    General: Skin is warm and dry.     Findings: No erythema or rash.  Neurological:     General: No focal deficit present.     Mental Status: Alert and oriented to person, place, and time. Mental status is at baseline.     Motor: No weakness.  Psychiatric:        Mood and Affect: Mood normal.        Behavior: Behavior normal.    Assessment/Plan: The patient is scheduled for bilateral breast reduction with Dr. {JYNWG:95621::"HYQMVH","QIONGEXBMW"}.  Risks, benefits, and alternatives of procedure discussed, questions answered and consent obtained.    Smoking Status: ***; Counseling Given? *** Last Mammogram: ***; Results: ***  Caprini Score: ***; Risk Factors include: ***, BMI *** 25, and length of planned surgery. Recommendation for mechanical *** pharmacological prophylaxis. Encourage early ambulation.   Pictures obtained: @consult ***  Post-op Rx sent to pharmacy: {Blank:19197::"Oxycodone, Zofran, Keflex","Oxycodone, Zofran"}  Patient was provided with the breast reduction and General Surgical Risk consent document and Pain Medication Agreement prior to their appointment.  They had adequate time to read through the risk consent documents and Pain Medication Agreement. We also discussed them in person together during this preop appointment. All of their questions were answered to their satisfaction.  Recommended calling if they have any further questions.  Risk consent form and Pain Medication Agreement to be scanned into patient's chart.  The risk that can be encountered with breast reduction were discussed and include the following but not limited to these:  Breast asymmetry, fluid accumulation, firmness of the breast, inability to breast feed, loss of nipple or areola, skin loss, decrease or no nipple sensation, fat necrosis of the breast tissue, bleeding, infection, healing delay.  There are risks of anesthesia, changes to skin sensation and injury to nerves or blood vessels.  The  muscle can be temporarily or permanently injured.  You may have an allergic reaction to tape, suture, glue, blood products which can result in skin discoloration, swelling, pain, skin lesions, poor healing.  Any of these can lead to the need for revisonal surgery or stage procedures.  A reduction has potential to interfere with diagnostic procedures.  Nipple or breast piercing can increase risks of infection.  This procedure is best done when the breast is fully developed.  Changes in the breast will continue to occur over time.  Pregnancy can alter the outcomes of previous breast reduction surgery, weight gain and weigh loss can also effect the long term appearance.     Electronically signed by: Laurena Spies, PA-C 10/16/2022 8:39 AM

## 2022-10-17 ENCOUNTER — Ambulatory Visit (INDEPENDENT_AMBULATORY_CARE_PROVIDER_SITE_OTHER): Payer: 59 | Admitting: Student

## 2022-10-17 ENCOUNTER — Encounter: Payer: Self-pay | Admitting: Student

## 2022-10-17 VITALS — BP 133/78 | HR 82 | Ht 61.0 in | Wt 162.0 lb

## 2022-10-17 DIAGNOSIS — N62 Hypertrophy of breast: Secondary | ICD-10-CM

## 2022-10-17 MED ORDER — TRAMADOL HCL 50 MG PO TABS: 50.0000 mg | ORAL_TABLET | Freq: Three times a day (TID) | ORAL | 0 refills | Status: DC | PRN

## 2022-10-17 MED ORDER — ONDANSETRON HCL 4 MG PO TABS: 4.0000 mg | ORAL_TABLET | Freq: Three times a day (TID) | ORAL | 0 refills | Status: DC | PRN

## 2022-10-18 ENCOUNTER — Telehealth: Payer: Self-pay | Admitting: Plastic Surgery

## 2022-10-18 NOTE — Telephone Encounter (Signed)
Pt left vm at 8:10am.  She has Sx scheduled for 11/05/22 and is waiting for an answer.  She would like you to call her at [phone].

## 2022-10-21 HISTORY — PX: REDUCTION MAMMAPLASTY: SUR839

## 2022-10-21 NOTE — Telephone Encounter (Signed)
Patient called and wanting to know the time of her discharge after surgery for the next day on 11/06/2022.  Patient has provide times to transportation company through [place].  Thanks

## 2022-10-25 ENCOUNTER — Other Ambulatory Visit: Payer: Self-pay

## 2022-10-25 ENCOUNTER — Encounter (HOSPITAL_BASED_OUTPATIENT_CLINIC_OR_DEPARTMENT_OTHER): Payer: Self-pay | Admitting: Plastic Surgery

## 2022-10-25 NOTE — Progress Notes (Signed)
   10/25/22 1445  Pre-op Phone Call  Surgery Date Verified 11/05/22  Arrival Time Verified 0715  Surgery Location Verified [facility] [place]  Medical History Reviewed Yes  Is the patient taking a GLP-1 receptor agonist? No  Does the patient have diabetes? No diagnosis of diabetes  Do you have a history of heart problems? No  Antiarrhythmic device type  (NA)  Does patient have other implanted devices? No  Patient Teaching  ([facility] (bring overnight bag, meds, toiletry bag, pillow, change of clothes... if needed))  Patient educated about smoking cessation 24 hours prior to surgery. N/A Non-Smoker  Patient verbalizes understanding of bowel prep? N/A  Med Rec Completed Yes  Take the Following Meds the Morning of Surgery take BP med with sip water DOS; hold herb/vit/nsaids x5d; bring meds DOS in anticipation of [facility] admission overnight  Recent  Lab Work, EKG, CXR? No  NPO (Including gum & candy) After midnight  Stop Solids, Milk, Candy, and Gum STARTING AT MIDNIGHT  Responsible adult to drive and be with you for 24 hours? No  Name & Phone Number for Ride/Caregiver pt arranging transportation through a service; will stay overnight in [facility]  No Jewelry, money, nail polish or make-up.  No lotions, powders, perfumes. No shaving  48 hrs. prior to surgery. Yes  Contacts, Dentures & Glasses Will Have to be Removed Before OR. Yes  Please bring your ID and Insurance Card the morning of your surgery. (Surgery Centers Only) Yes  Bring any papers or x-rays with you that your surgeon gave you. Yes  Instructed to contact the location of procedure/ provider if they or anyone in their household develops symptoms or tests positive for COVID-19, has close contact with someone who tests positive for COVID, or has known exposure to any contagious illness. Yes  Call this number the morning of surgery  with any problems that may cancel your surgery. [phone]  Covid-19 Assessment  Have you had a positive COVID-19 test within the  previous 90 days? No  COVID Testing Guidance Proceed with the additional questions.  Patient's surgery required a COVID-19 test (cardiothoracic, complex ENT, and bronchoscopies/ EBUS) No  Have you been unmasked and in close contact with anyone with COVID-19 or COVID-19 symptoms within the past 10 days? No  Do you or anyone in your household currently have any COVID-19 symptoms? No

## 2022-10-28 NOTE — Progress Notes (Unsigned)
   Established Patient Office Visit  Subjective   Patient ID: Hannah Barnett, female    DOB: 1951/04/19  Age: 72 y.o. MRN: [id number]  No chief complaint on file.   HPI  Patient is here for pre-op examination.  Type of Surgery: breast reduction Date of Surgery: 11/05/22 Location of Surgery: Doctor Performing Surgery: Hx of surgical complications: Hx of anesthesia complications Hx of MI/stenting, COPD, DM Hx of smoking? ETOH? Drug use? Revised Cardiac Risk Index for major cardiac event (RCRI): Pulmonary Risk Score (ARISCAT): MET Score EKG: CXR: Labwork: This patient is Low/Mod/High pulm risk and low/mod/high cardiac risk with &gt;4/&lt;4 MET score. Patient may/may not proceed with low/mod/high risk procedure as previously planned.   {History (Optional):23778}  ROS    Objective:     There were no vitals taken for this visit. {Vitals History (Optional):23777}  Physical Exam   No results found for any visits on 10/29/22.  {Labs (Optional):23779}  The 10-year ASCVD risk score (Arnett DK, et al., 2019) is: 13.4%    Assessment & Plan:   Problem List Items Addressed This Visit   None   No follow-ups on file.    Margarita Mail, DO

## 2022-10-29 ENCOUNTER — Ambulatory Visit (INDEPENDENT_AMBULATORY_CARE_PROVIDER_SITE_OTHER): Payer: 59 | Admitting: Internal Medicine

## 2022-10-29 VITALS — BP 132/76 | HR 116 | Temp 97.9°F | Resp 18 | Ht 61.5 in | Wt 162.0 lb

## 2022-10-29 DIAGNOSIS — D75839 Thrombocytosis, unspecified: Secondary | ICD-10-CM

## 2022-10-29 DIAGNOSIS — N62 Hypertrophy of breast: Secondary | ICD-10-CM

## 2022-10-29 DIAGNOSIS — Z01818 Encounter for other preprocedural examination: Secondary | ICD-10-CM

## 2022-10-29 LAB — CBC WITH DIFFERENTIAL/PLATELET
Absolute Monocytes: 524 cells/uL (ref 200–950)
Basophils Absolute: 22 cells/uL (ref 0–200)
Basophils Relative: 0.4 %
Eosinophils Absolute: 0 cells/uL — ABNORMAL LOW (ref 15–500)
Eosinophils Relative: 0 %
HCT: 37.8 % (ref 35.0–45.0)
Hemoglobin: 12.7 g/dL (ref 11.7–15.5)
Lymphs Abs: 2165 cells/uL (ref 850–3900)
MCH: 26.6 pg — ABNORMAL LOW (ref 27.0–33.0)
MCHC: 33.6 g/dL (ref 32.0–36.0)
MCV: 79.2 fL — ABNORMAL LOW (ref 80.0–100.0)
MPV: 11.2 fL (ref 7.5–12.5)
Monocytes Relative: 9.7 %
Neutro Abs: 2689 cells/uL (ref 1500–7800)
Neutrophils Relative %: 49.8 %
Platelets: 394 10*3/uL (ref 140–400)
RBC: 4.77 10*6/uL (ref 3.80–5.10)
RDW: 15.4 % — ABNORMAL HIGH (ref 11.0–15.0)
Total Lymphocyte: 40.1 %
WBC: 5.4 10*3/uL (ref 3.8–10.8)

## 2022-10-30 ENCOUNTER — Encounter: Payer: Self-pay | Admitting: Student

## 2022-10-30 ENCOUNTER — Telehealth: Payer: Self-pay | Admitting: Plastic Surgery

## 2022-10-30 NOTE — Telephone Encounter (Signed)
Good Morning,   No green tea prior to surgery, she can drink black tea. And thank you for the heads up.   Alan Ripper

## 2022-10-30 NOTE — Telephone Encounter (Signed)
Thank you :)

## 2022-10-30 NOTE — Telephone Encounter (Signed)
Pt wants to know if she can drink green tea decaf and honey?

## 2022-10-30 NOTE — Progress Notes (Signed)
Surgical Clearance has been received from patient's PCP, Dr. Margarita Mail, for patient's upcoming surgery with Dr. Ladona Ridgel .

## 2022-11-05 ENCOUNTER — Encounter (HOSPITAL_BASED_OUTPATIENT_CLINIC_OR_DEPARTMENT_OTHER): Payer: Self-pay | Admitting: Plastic Surgery

## 2022-11-05 ENCOUNTER — Observation Stay (HOSPITAL_BASED_OUTPATIENT_CLINIC_OR_DEPARTMENT_OTHER)
Admission: RE | Admit: 2022-11-05 | Discharge: 2022-11-06 | Disposition: A | Discharge status: Home / Self Care | Payer: 59 | Attending: Plastic Surgery | Admitting: Plastic Surgery

## 2022-11-05 ENCOUNTER — Other Ambulatory Visit: Payer: Self-pay

## 2022-11-05 ENCOUNTER — Ambulatory Visit (HOSPITAL_BASED_OUTPATIENT_CLINIC_OR_DEPARTMENT_OTHER): Payer: Self-pay | Admitting: Anesthesiology

## 2022-11-05 ENCOUNTER — Encounter (HOSPITAL_BASED_OUTPATIENT_CLINIC_OR_DEPARTMENT_OTHER)
Admission: RE | Disposition: A | Discharge status: Home / Self Care | Payer: Self-pay | Source: Home / Self Care | Attending: Plastic Surgery

## 2022-11-05 ENCOUNTER — Ambulatory Visit (HOSPITAL_BASED_OUTPATIENT_CLINIC_OR_DEPARTMENT_OTHER): Payer: 59 | Admitting: Anesthesiology

## 2022-11-05 DIAGNOSIS — I10 Essential (primary) hypertension: Secondary | ICD-10-CM | POA: Insufficient documentation

## 2022-11-05 DIAGNOSIS — M549 Dorsalgia, unspecified: Secondary | ICD-10-CM | POA: Insufficient documentation

## 2022-11-05 DIAGNOSIS — N62 Hypertrophy of breast: Principal | ICD-10-CM | POA: Insufficient documentation

## 2022-11-05 DIAGNOSIS — Z79899 Other long term (current) drug therapy: Secondary | ICD-10-CM | POA: Insufficient documentation

## 2022-11-05 DIAGNOSIS — Z87891 Personal history of nicotine dependence: Secondary | ICD-10-CM | POA: Diagnosis not present

## 2022-11-05 DIAGNOSIS — Z419 Encounter for procedure for purposes other than remedying health state, unspecified: Principal | ICD-10-CM

## 2022-11-05 DIAGNOSIS — M542 Cervicalgia: Secondary | ICD-10-CM

## 2022-11-05 HISTORY — PX: BREAST REDUCTION SURGERY: SHX8

## 2022-11-05 SURGERY — MAMMOPLASTY, REDUCTION
Anesthesia: General | Site: Breast | Laterality: Bilateral

## 2022-11-05 MED ORDER — CHLORHEXIDINE GLUCONATE CLOTH 2 % EX PADS: 6.0000 | MEDICATED_PAD | Freq: Once | CUTANEOUS | Status: DC

## 2022-11-05 MED ORDER — PROPOFOL 10 MG/ML IV BOLUS
INTRAVENOUS | Status: DC | PRN
  Administered 2022-11-05: 150 mg via INTRAVENOUS
  Administered 2022-11-05: 30 mg via INTRAVENOUS

## 2022-11-05 MED ORDER — SODIUM CHLORIDE (PF) 0.9 % IJ SOLN
INTRAMUSCULAR | Status: AC
  Filled 2022-11-05: qty 20

## 2022-11-05 MED ORDER — BUPIVACAINE LIPOSOME 1.3 % IJ SUSP
INTRAMUSCULAR | Status: AC
  Filled 2022-11-05: qty 20

## 2022-11-05 MED ORDER — FENTANYL CITRATE (PF) 100 MCG/2ML IJ SOLN
INTRAMUSCULAR | Status: AC
  Filled 2022-11-05: qty 2

## 2022-11-05 MED ORDER — 0.9 % SODIUM CHLORIDE (POUR BTL) OPTIME
TOPICAL | Status: DC | PRN
  Administered 2022-11-05: 2000 mL

## 2022-11-05 MED ORDER — ACETAMINOPHEN 325 MG PO TABS
650.0000 mg | ORAL_TABLET | Freq: Four times a day (QID) | ORAL | Status: DC | PRN
  Administered 2022-11-05 – 2022-11-06 (×2): 650 mg via ORAL
  Filled 2022-11-05 (×2): qty 2

## 2022-11-05 MED ORDER — ACETAMINOPHEN 500 MG PO TABS
1000.0000 mg | ORAL_TABLET | Freq: Once | ORAL | Status: AC
  Administered 2022-11-05: 1000 mg via ORAL

## 2022-11-05 MED ORDER — SODIUM CHLORIDE 0.9 % IV SOLN
INTRAVENOUS | Status: DC | PRN
  Administered 2022-11-05: 40 mL

## 2022-11-05 MED ORDER — CEFAZOLIN SODIUM-DEXTROSE 2-4 GM/100ML-% IV SOLN
2.0000 g | INTRAVENOUS | Status: AC
  Administered 2022-11-05: 2 g via INTRAVENOUS

## 2022-11-05 MED ORDER — ACETAMINOPHEN 500 MG PO TABS
ORAL_TABLET | ORAL | Status: AC
  Filled 2022-11-05: qty 2

## 2022-11-05 MED ORDER — EPHEDRINE SULFATE (PRESSORS) 50 MG/ML IJ SOLN
INTRAMUSCULAR | Status: DC | PRN
  Administered 2022-11-05: 5 mg via INTRAVENOUS

## 2022-11-05 MED ORDER — CEFAZOLIN SODIUM-DEXTROSE 2-4 GM/100ML-% IV SOLN
INTRAVENOUS | Status: AC
  Filled 2022-11-05: qty 100

## 2022-11-05 MED ORDER — DEXAMETHASONE SODIUM PHOSPHATE 10 MG/ML IJ SOLN
INTRAMUSCULAR | Status: AC
  Filled 2022-11-05: qty 1

## 2022-11-05 MED ORDER — IBUPROFEN 600 MG PO TABS: 600.0000 mg | ORAL_TABLET | Freq: Four times a day (QID) | ORAL | Status: DC | PRN

## 2022-11-05 MED ORDER — ACETAMINOPHEN 325 MG RE SUPP: 650.0000 mg | Freq: Four times a day (QID) | RECTAL | Status: DC | PRN

## 2022-11-05 MED ORDER — DEXMEDETOMIDINE HCL IN NACL 80 MCG/20ML IV SOLN
INTRAVENOUS | Status: DC | PRN
  Administered 2022-11-05: 8 ug via INTRAVENOUS

## 2022-11-05 MED ORDER — OXYCODONE HCL 5 MG PO TABS
5.0000 mg | ORAL_TABLET | Freq: Four times a day (QID) | ORAL | Status: DC | PRN
  Administered 2022-11-05: 5 mg via ORAL
  Filled 2022-11-05: qty 1

## 2022-11-05 MED ORDER — DEXAMETHASONE SODIUM PHOSPHATE 4 MG/ML IJ SOLN
INTRAMUSCULAR | Status: DC | PRN
  Administered 2022-11-05: 4 mg via INTRAVENOUS

## 2022-11-05 MED ORDER — SUGAMMADEX SODIUM 200 MG/2ML IV SOLN
INTRAVENOUS | Status: DC | PRN
  Administered 2022-11-05: 200 mg via INTRAVENOUS

## 2022-11-05 MED ORDER — ROCURONIUM BROMIDE 100 MG/10ML IV SOLN
INTRAVENOUS | Status: DC | PRN
  Administered 2022-11-05: 60 mg via INTRAVENOUS

## 2022-11-05 MED ORDER — LACTATED RINGERS IV SOLN: INTRAVENOUS | Status: DC

## 2022-11-05 MED ORDER — LIDOCAINE HCL (CARDIAC) PF 100 MG/5ML IV SOSY
PREFILLED_SYRINGE | INTRAVENOUS | Status: DC | PRN
  Administered 2022-11-05: 80 mg via INTRAVENOUS

## 2022-11-05 MED ORDER — FENTANYL CITRATE (PF) 100 MCG/2ML IJ SOLN
INTRAMUSCULAR | Status: DC | PRN
  Administered 2022-11-05: 50 ug via INTRAVENOUS
  Administered 2022-11-05: 25 ug via INTRAVENOUS
  Administered 2022-11-05 (×3): 50 ug via INTRAVENOUS
  Administered 2022-11-05: 25 ug via INTRAVENOUS
  Administered 2022-11-05: 50 ug via INTRAVENOUS

## 2022-11-05 MED ORDER — ONDANSETRON HCL 4 MG/2ML IJ SOLN
INTRAMUSCULAR | Status: DC | PRN
  Administered 2022-11-05: 4 mg via INTRAVENOUS

## 2022-11-05 MED ORDER — ONDANSETRON HCL 4 MG/2ML IJ SOLN
INTRAMUSCULAR | Status: AC
  Filled 2022-11-05: qty 2

## 2022-11-05 SURGICAL SUPPLY — 61 items
ADH SKN CLS APL DERMABOND .7 (GAUZE/BANDAGES/DRESSINGS) ×2
BINDER BREAST 3XL (GAUZE/BANDAGES/DRESSINGS) IMPLANT
BINDER BREAST LRG (GAUZE/BANDAGES/DRESSINGS) IMPLANT
BINDER BREAST MEDIUM (GAUZE/BANDAGES/DRESSINGS) IMPLANT
BINDER BREAST XLRG (GAUZE/BANDAGES/DRESSINGS) IMPLANT
BINDER BREAST XXLRG (GAUZE/BANDAGES/DRESSINGS) IMPLANT
BIOPATCH RED 1 DISK 7.0 (GAUZE/BANDAGES/DRESSINGS) ×2 IMPLANT
BLADE SURG 10 STRL SS (BLADE) ×4 IMPLANT
BLADE SURG 15 STRL LF DISP TIS (BLADE) ×1 IMPLANT
BLADE SURG 15 STRL SS (BLADE) ×1
CANISTER SUCT 1200ML W/VALVE (MISCELLANEOUS) ×1 IMPLANT
DERMABOND ADVANCED .7 DNX12 (GAUZE/BANDAGES/DRESSINGS) ×2 IMPLANT
DRAIN CHANNEL 19F RND (DRAIN) ×2 IMPLANT
DRAPE UTILITY XL STRL (DRAPES) ×1 IMPLANT
DRSG TEGADERM 4X4.75 (GAUZE/BANDAGES/DRESSINGS) ×2 IMPLANT
ELECT BLADE 4.0 EZ CLEAN MEGAD (MISCELLANEOUS) ×1
ELECT REM PT RETURN 9FT ADLT (ELECTROSURGICAL) ×2
ELECTRODE BLDE 4.0 EZ CLN MEGD (MISCELLANEOUS) ×1 IMPLANT
ELECTRODE REM PT RTRN 9FT ADLT (ELECTROSURGICAL) ×2 IMPLANT
EVACUATOR SILICONE 100CC (DRAIN) ×2 IMPLANT
GAUZE PAD ABD 8X10 STRL (GAUZE/BANDAGES/DRESSINGS) ×2 IMPLANT
GAUZE SPONGE 2X2 STRL 8-PLY (GAUZE/BANDAGES/DRESSINGS) ×2 IMPLANT
GLOVE BIO SURGEON STRL SZ 6.5 (GLOVE) IMPLANT
GLOVE BIO SURGEON STRL SZ7.5 (GLOVE) ×1 IMPLANT
GLOVE BIO SURGEON STRL SZ8 (GLOVE) ×1 IMPLANT
GLOVE BIOGEL PI IND STRL 7.0 (GLOVE) IMPLANT
GLOVE BIOGEL PI IND STRL 7.5 (GLOVE) IMPLANT
GLOVE BIOGEL PI IND STRL 8 (GLOVE) ×1 IMPLANT
GLOVE SURG SS PI 7.0 STRL IVOR (GLOVE) IMPLANT
GLOVE SURG SYN 7.5 E (GLOVE) ×1 IMPLANT
GLOVE SURG SYN 7.5 PF PI (GLOVE) IMPLANT
GOWN STRL REUS W/ TWL LRG LVL3 (GOWN DISPOSABLE) IMPLANT
GOWN STRL REUS W/ TWL XL LVL3 (GOWN DISPOSABLE) IMPLANT
GOWN STRL REUS W/TWL LRG LVL3 (GOWN DISPOSABLE) ×1
GOWN STRL REUS W/TWL XL LVL3 (GOWN DISPOSABLE) ×3 IMPLANT
HEMOSTAT ARISTA ABSORB 3G PWDR (HEMOSTASIS) IMPLANT
HIBICLENS CHG 4% 4OZ BTL (MISCELLANEOUS) ×1 IMPLANT
MARKER SKIN DUAL TIP RULER LAB (MISCELLANEOUS) ×1 IMPLANT
NDL HYPO 25X1 1.5 SAFETY (NEEDLE) IMPLANT
NEEDLE HYPO 25X1 1.5 SAFETY (NEEDLE) ×1 IMPLANT
NS IRRIG 1000ML POUR BTL (IV SOLUTION) ×1 IMPLANT
PACK BASIN DAY SURGERY FS (CUSTOM PROCEDURE TRAY) ×1 IMPLANT
PACK UNIVERSAL I (CUSTOM PROCEDURE TRAY) ×1 IMPLANT
PENCIL SMOKE EVACUATOR (MISCELLANEOUS) ×2 IMPLANT
PIN SAFETY STERILE (MISCELLANEOUS) IMPLANT
SLEEVE SCD COMPRESS KNEE MED (STOCKING) ×1 IMPLANT
SPONGE T-LAP 18X18 ~~LOC~~+RFID (SPONGE) ×3 IMPLANT
STAPLER VISISTAT 35W (STAPLE) ×1 IMPLANT
SUT MNCRL AB 3-0 PS2 27 (SUTURE) ×4 IMPLANT
SUT MNCRL AB 4-0 PS2 18 (SUTURE) ×4 IMPLANT
SUT MON AB 2-0 CT1 36 (SUTURE) ×1 IMPLANT
SUT MON AB 5-0 PS2 18 (SUTURE) IMPLANT
SUT SILK 2 0 SH (SUTURE) ×2 IMPLANT
SUT VIC AB 3-0 SH 27 (SUTURE)
SUT VIC AB 3-0 SH 27X BRD (SUTURE) IMPLANT
SYR BULB IRRIG 60ML STRL (SYRINGE) ×1 IMPLANT
TOWEL GREEN STERILE FF (TOWEL DISPOSABLE) ×2 IMPLANT
TRAY DSU PREP LF (CUSTOM PROCEDURE TRAY) ×1 IMPLANT
TUBE CONNECTING 20X1/4 (TUBING) ×1 IMPLANT
UNDERPAD 30X36 HEAVY ABSORB (UNDERPADS AND DIAPERS) ×2 IMPLANT
YANKAUER SUCT BULB TIP NO VENT (SUCTIONS) ×1 IMPLANT

## 2022-11-05 NOTE — Discharge Instructions (Signed)
JP Drain [employer] this sheet to all of your post-operative appointments while you have your drains.  Please measure your drains by CC's or ML's.  Make sure you drain and measure your JP Drains 2 or 3 times per day.  At the end of each day, add up totals for the left side and add up totals for the right side.    ( 9 am )     ( 3 pm )        ( 9 pm )                Date L  R  L  R  L  R  Total L/R                                                                                                                                                                                        Post Anesthesia Home Care Instructions  Activity: Get plenty of rest for the remainder of the day. A responsible individual must stay with you for 24 hours following the procedure.  For the next 24 hours, DO NOT: -Drive a car -Paediatric nurse -Drink alcoholic beverages -Take any medication unless instructed by your physician -Make any legal decisions or sign important papers.  Meals: Start with liquid foods such as gelatin or soup. Progress to regular foods as tolerated. Avoid greasy, spicy, heavy foods. If nausea and/or vomiting occur, drink only clear liquids until the nausea and/or vomiting subsides. Call your physician if vomiting continues.  Special Instructions/Symptoms: Your throat may feel dry or sore from the anesthesia or the breathing tube placed in your throat during surgery. If this causes discomfort, gargle with warm salt water. The discomfort should disappear within 24 hours.  If you had a scopolamine patch placed behind your ear for the management of post- operative nausea and/or vomiting:  1. The medication in the patch is effective for 72 hours, after which it should be removed.  Wrap patch in a tissue and discard in the trash. Wash hands thoroughly with soap and water. 2. You may remove the patch earlier than 72 hours if you experience unpleasant side effects which  may include dry mouth, dizziness or visual disturbances. 3. Avoid touching the patch. Wash your hands with soap and water after contact with the patch.    Information for Discharge Teaching: EXPAREL (bupivacaine liposome injectable suspension)   Your surgeon or anesthesiologist gave you EXPAREL(bupivacaine) to help control your pain after surgery.   EXPAREL is a local anesthetic that provides pain relief by numbing the tissue around the surgical site.  EXPAREL is  designed to release pain medication over time and can control pain for up to 72 hours.  Depending on how you respond to EXPAREL, you may require less pain medication during your recovery.  Possible side effects:  Temporary loss of sensation or ability to move in the area where bupivacaine was injected.  Nausea, vomiting, constipation  Rarely, numbness and tingling in your mouth or lips, lightheadedness, or anxiety may occur.  Call your doctor right away if you think you may be experiencing any of these sensations, or if you have other questions regarding possible side effects.  Follow all other discharge instructions given to you by your surgeon or nurse. Eat a healthy diet and drink plenty of water or other fluids.  If you return to the hospital for any reason within 96 hours following the administration of EXPAREL, it is important for health care providers to know that you have received this anesthetic. A teal colored band has been placed on your arm with the date, time and amount of EXPAREL you have received in order to alert and inform your health care providers. Please leave this armband in place for the full 96 hours following administration, and then you may remove the band.

## 2022-11-05 NOTE — Interval H&P Note (Signed)
History and Physical Interval Note: No change in indication for surgery or examination All questions answered to her satisfaction. Marked for a breast reduction with her assistance. Will proceed at her request.   11/05/2022 9:00 AM  Hannah Barnett  has presented today for surgery, with the diagnosis of Macromastia.  The various methods of treatment have been discussed with the patient and family. After consideration of risks, benefits and other options for treatment, the patient has consented to  Procedure(s): MAMMARY REDUCTION  (BREAST) (Bilateral) as a surgical intervention.  The patient's history has been reviewed, patient examined, no change in status, stable for surgery.  I have reviewed the patient's chart and labs.  Questions were answered to the patient's satisfaction.     Santiago Glad

## 2022-11-05 NOTE — Op Note (Signed)
DATE OF OPERATION: 11/05/2022  LOCATION: [facility] operating Room  PREOPERATIVE DIAGNOSIS: Macromastia  POSTOPERATIVE DIAGNOSIS: Same  PROCEDURE: Bilateral breast reduction  SURGEON: Loren Racer, MD  ASSISTANT: Caroline More  EBL: 100 cc  CONDITION: Stable  COMPLICATIONS: None  INDICATION: The patient, Hannah Barnett, is a 72 y.o. female born on 02-11-1951, is here for treatment of upper back and neck pain patient feels is due to the large size of her breast..   PROCEDURE DETAILS:  The patient was seen prior to surgery and marked.   IV antibiotics were given. The patient was taken to the operating room and given a general anesthetic. A standard time out was performed and all information was confirmed by those in the room. SCDs were placed.   The chest was prepped and draped in the usual sterile manner.  The nipple and areolar complexes were marked with a 42 mm cookie cutter and an 8 cm based inferior pedicle was outlined on the chest wall.  The right breast was addressed first.  A laparotomy tape was placed at the base of breast as a tourniquet.  The pedicle was de-epithelialized sharply.  The electrocautery was used to dissected the borders of the pedicle down to the chest wall.  Electrocautery was then used to resect the medial lateral and superior triangles of breast tissue and to develop and thin the superior skin flap.  This constituted the bulk of the reduction.  The tissue removed weighed 526 g.  The tissue was sent to pathology for routine examination.  The surgical site was inspected for bleeding and hemostasis achieved with the electrocautery.  The surgical site was irrigated with warm normal saline.  Exparel was then infiltrated under the pectoralis fascia and in the subcutaneous tissues.  A 19 French round drain was placed behind the pedicle and brought out through a separate stab incision.  The T point was approximated with a single 2-0 Monocryl suture.  Skin edges were  tailor tacked in place with skin clips.  The dermis was approximated with interrupted and running 3-0 Monocryl sutures and the skin was closed with a running 4-0 Monocryl subcuticular stitch.  Attention was then turned to the left breast where similar procedure was performed.  Laparotomy tape was placed at the base of the breast as a tourniquet and the pedicle was de-epithelialized sharply then dissected down to the chest wall with the electrocautery.  Electrocautery was used to resect the medial lateral and superior triangles of breast tissue and to develop and thin the superior skin flap.  The breast tissue removed constituted the bulk of the reduction and weighed 718 g.  The tissue was sent to pathology for routine examination.  Hemostasis was achieved and the surgical site was irrigated with saline.  Subcutaneous tissues were infiltrated with Exparel as was the pectoralis in the sub-fascial plane.  The T point was approximated with a single 2-0 Monocryl suture and the skin edges tailor tacked in place with skin clips.  The dermis was closed with interrupted and running 3-0 Monocryl sutures and the skin closed with a running 4-0 Monocryl subcuticular stitch.  To close the incisions a 19 French round drain was placed behind the pedicle and brought out through a separate stab incision.  The incisions were all sealed with Dermabond.  The patient was placed in a supportive garment and awakened from anesthesia without incident.  She was transferred to the recovery room in good condition.  All instrument needle and sponge counts were  reported as correct. The patient was allowed to wake up and taken to recovery room in stable condition at the end of the case.  The patient will be kept overnight at her request due to lack of a ride home.  The advanced practice practitioner (APP) assisted throughout the case.  The APP was essential in retraction and counter traction when needed to make the case progress smoothly.  This  retraction and assistance made it possible to see the tissue plans for the procedure.  The assistance was needed for blood control, tissue re-approximation and assisted with closure of the incision site.

## 2022-11-05 NOTE — Anesthesia Preprocedure Evaluation (Signed)
Anesthesia Evaluation  Patient identified by MRN, date of birth, ID band Patient awake    Reviewed: Allergy & Precautions, NPO status , Patient's Chart, lab work & pertinent test results  Airway Mallampati: III  TM Distance: >3 FB Neck ROM: Full    Dental  (+) Dental Advisory Given, Caps, Partial Upper, Partial Lower   Pulmonary former smoker   Pulmonary exam normal breath sounds clear to auscultation       Cardiovascular hypertension, Pt. on medications (-) angina (-) Past MI Normal cardiovascular exam Rhythm:Regular Rate:Normal     Neuro/Psych negative neurological ROS  negative psych ROS   GI/Hepatic negative GI ROS, Neg liver ROS,,,  Endo/Other  negative endocrine ROS    Renal/GU negative Renal ROS     Musculoskeletal negative musculoskeletal ROS (+)    Abdominal   Peds  Hematology negative hematology ROS (+)   Anesthesia Other Findings Day of surgery medications reviewed with the patient.  Reproductive/Obstetrics                             Anesthesia Physical Anesthesia Plan  ASA: 2  Anesthesia Plan: General   Post-op Pain Management: Tylenol PO (pre-op)*   Induction: Intravenous  PONV Risk Score and Plan: 3 and Dexamethasone and Ondansetron  Airway Management Planned: Oral ETT  Additional Equipment:   Intra-op Plan:   Post-operative Plan: Extubation in OR  Informed Consent: I have reviewed the patients History and Physical, chart, labs and discussed the procedure including the risks, benefits and alternatives for the proposed anesthesia with the patient or authorized representative who has indicated his/her understanding and acceptance.     Dental advisory given  Plan Discussed with: CRNA  Anesthesia Plan Comments:        Anesthesia Quick Evaluation

## 2022-11-05 NOTE — Transfer of Care (Signed)
Immediate Anesthesia Transfer of Care Note  Patient: Hannah Barnett  Procedure(s) Performed: BILATERAL MAMMARY REDUCTION  (BREAST) (Bilateral: Breast)  Patient Location: PACU  Anesthesia Type:General  Level of Consciousness: sedated  Airway & Oxygen Therapy: Patient Spontanous Breathing and Patient connected to face mask oxygen  Post-op Assessment: Report given to RN and Post -op Vital signs reviewed and stable  Post vital signs: Reviewed and stable  Last Vitals:  Vitals Value Taken Time  BP 134/71 11/05/22 1230  Temp    Pulse    Resp 12 11/05/22 1230  SpO2    Vitals shown include unfiled device data.  Last Pain:  Vitals:   11/05/22 0735  TempSrc: Oral  PainSc: 0-No pain         Complications: No notable events documented.

## 2022-11-05 NOTE — Anesthesia Procedure Notes (Signed)
Procedure Name: Intubation Date/Time: 11/05/2022 9:19 AM  Performed by: Burna Cash, CRNAPre-anesthesia Checklist: Patient identified, Emergency Drugs available, Suction available and Patient being monitored Patient Re-evaluated:Patient Re-evaluated prior to induction Oxygen Delivery Method: Circle system utilized Preoxygenation: Pre-oxygenation with 100% oxygen Induction Type: IV induction Ventilation: Mask ventilation without difficulty Laryngoscope Size: Mac and 3 Grade View: Grade I Tube type: Oral Tube size: 7.0 mm Number of attempts: 1 Airway Equipment and Method: Stylet and Oral airway Placement Confirmation: ETT inserted through vocal cords under direct vision, positive ETCO2 and breath sounds checked- equal and bilateral Secured at: 20 cm Tube secured with: Tape Dental Injury: Teeth and Oropharynx as per pre-operative assessment

## 2022-11-06 ENCOUNTER — Encounter (HOSPITAL_BASED_OUTPATIENT_CLINIC_OR_DEPARTMENT_OTHER): Payer: Self-pay | Admitting: Plastic Surgery

## 2022-11-06 MED ORDER — ONDANSETRON HCL 4 MG/2ML IJ SOLN: 4.0000 mg | Freq: Once | INTRAMUSCULAR | Status: DC | PRN

## 2022-11-06 MED ORDER — FENTANYL CITRATE (PF) 100 MCG/2ML IJ SOLN: 25.0000 ug | INTRAMUSCULAR | Status: DC | PRN

## 2022-11-06 NOTE — Discharge Summary (Signed)
Physician Discharge Summary  Patient ID: Hannah Barnett MRN: [id number] DOB/AGE: 71/09/1950 71 y.o.  Admit date: 11/05/2022 Discharge date: 11/06/2022  Admission Diagnoses: Macromastia  Discharge Diagnoses:  Principal Problem:   Macromastia   Discharged Condition: good  Hospital Course: Patient presented to the surgery center yesterday.  Patient underwent bilateral breast reduction with Dr. Ladona Ridgel.  Patient was admitted for pain control and observation.  She is postop day 1.  Upon entering the room this morning, patient is sitting in bed in no acute distress.  Patient reports she is doing well.  She denies any acute issues overnight.  She reports her pain is overall controlled and has only had to take Tylenol this morning.  She reports she is eating and drinking without issue.  She states that she is ambulated several times since surgery yesterday.  Patient reports she is ready to go home today.  Consults: None  Significant Diagnostic Studies: N/A  Treatments: surgery: Bilateral breast reduction  Discharge Exam: Blood pressure 125/64, pulse 95, temperature 98.2 F (36.8 C), resp. rate 20, height 5' 1.5" (1.562 m), weight 72.5 kg, SpO2 100%. General appearance: alert, cooperative, and no distress Resp: Unlabored breathing, no respiratory distress Breasts: Mild swelling bilaterally, but are soft and fairly symmetric.  NAC's appear viable bilaterally.  There is no overlying erythema and very minimal ecchymosis to breast bilaterally.  There are no significant fluid collections palpated on exam.  Incisions appear to be intact.  JP drains are in place and functioning bilaterally.  There is serosanguineous drainage with some bloody drainage in the bulbs bilaterally.  Bilateral JP drains were removed without difficulty.  Patient tolerated well.  Disposition: Discharge disposition: 01-Home or Self Care     Discussed postop instructions with patient and all of her questions were answered  to her satisfaction.  Instructed patient to call our clinic if she has any questions or concerns about anything.  Discharge Instructions     (HEART FAILURE PATIENTS) Call MD:  Anytime you have any of the following symptoms: 1) 3 pound weight gain in 24 hours or 5 pounds in 1 week 2) shortness of breath, with or without a dry hacking cough 3) swelling in the hands, feet or stomach 4) if you have to sleep on extra pillows at night in order to breathe.   Complete by: As directed    Call MD for:  difficulty breathing, headache or visual disturbances   Complete by: As directed    Call MD for:  extreme fatigue   Complete by: As directed    Call MD for:  hives   Complete by: As directed    Call MD for:  persistant dizziness or light-headedness   Complete by: As directed    Call MD for:  persistant nausea and vomiting   Complete by: As directed    Call MD for:  redness, tenderness, or signs of infection (pain, swelling, redness, odor or green/yellow discharge around incision site)   Complete by: As directed    Call MD for:  severe uncontrolled pain   Complete by: As directed    Call MD for:  temperature >100.4   Complete by: As directed    Diet - low sodium heart healthy   Complete by: As directed    Increase activity slowly   Complete by: As directed       Allergies as of 11/06/2022       Reactions   Atorvastatin Other (See Comments)   Severe myalgia  Lisinopril Nausea And Vomiting        Medication List     TAKE these medications    amLODipine 2.5 MG tablet Commonly known as: NORVASC Take 1 tablet (2.5 mg total) by mouth daily.   Blood Pressure Monitor/L Cuff Misc 1 each by Does not apply route daily.   Cholecalciferol 50 MCG (2000 UT) Caps Take by mouth.   multivitamin with minerals Tabs tablet Take 1 tablet by mouth daily.   ondansetron 4 MG tablet Commonly known as: Zofran Take 1 tablet (4 mg total) by mouth every 8 (eight) hours as needed for up to 20 doses  for nausea or vomiting.   rosuvastatin 10 MG tablet Commonly known as: CRESTOR Take 1 tablet (10 mg total) by mouth at bedtime.   traMADol 50 MG tablet Commonly known as: Ultram Take 1 tablet (50 mg total) by mouth every 8 (eight) hours as needed for up to 20 doses for moderate pain or severe pain.   triamcinolone 55 MCG/ACT Aero nasal inhaler Commonly known as: NASACORT SMARTSIG:Both Nares        Follow-up Information     [facility] Follow up.   Specialty: Plastic Surgery Why: Follow up at your scheduled appointment next week for postoperative evaluation Contact information: [address] [place] [address] [phone]                [facility] [address] [place], [place] [address] [phone]  Signed: Laurena Spies 11/06/2022, 9:00 AM

## 2022-11-06 NOTE — Anesthesia Postprocedure Evaluation (Addendum)
Anesthesia Post Note  Patient: Hannah Barnett  Procedure(s) Performed: BILATERAL MAMMARY REDUCTION  (BREAST) (Bilateral: Breast)     Patient location during evaluation: PACU Anesthesia Type: General Level of consciousness: awake and alert Pain management: pain level controlled Vital Signs Assessment: post-procedure vital signs reviewed and stable Respiratory status: spontaneous breathing, nonlabored ventilation, respiratory function stable and patient connected to nasal cannula oxygen Cardiovascular status: blood pressure returned to baseline and stable Postop Assessment: no apparent nausea or vomiting Anesthetic complications: no   No notable events documented.  Last Vitals:  Vitals:   11/06/22 0600 11/06/22 0715  BP: 113/69   Pulse: 77 81  Resp: 16   Temp: 36.8 C   SpO2: 100% 99%    Last Pain:  Vitals:   11/06/22 0715  TempSrc:   PainSc: 3                  Collene Schlichter

## 2022-11-07 LAB — SURGICAL PATHOLOGY

## 2022-11-14 ENCOUNTER — Encounter: Payer: Self-pay | Admitting: Plastic Surgery

## 2022-11-14 ENCOUNTER — Ambulatory Visit (INDEPENDENT_AMBULATORY_CARE_PROVIDER_SITE_OTHER): Payer: 59 | Admitting: Plastic Surgery

## 2022-11-14 VITALS — BP 133/73 | HR 85 | Wt 158.6 lb

## 2022-11-14 DIAGNOSIS — S21001A Unspecified open wound of right breast, initial encounter: Secondary | ICD-10-CM

## 2022-11-14 DIAGNOSIS — Z9889 Other specified postprocedural states: Secondary | ICD-10-CM

## 2022-11-14 MED ORDER — CEPHALEXIN 500 MG PO CAPS: 500.0000 mg | ORAL_CAPSULE | Freq: Four times a day (QID) | ORAL | 0 refills | Status: DC

## 2022-11-14 NOTE — Progress Notes (Signed)
Hannah Barnett returns today 9 days postop after her bilateral breast reduction.  She is very tearful and upset because she feels that she is not healing properly.  She notes minor pain in her breast which is controllable with Tylenol for arthritis.  On examination she has good shape and size with no evidence of hematoma.  The nipples are viable and well-perfused and have feeling.  She does have an area on the right breast just inferior and lateral to the nipple where the skin has become necrotic and there is epidermal lysis.  This area is approximately 5 x 4 cm.  There is also a very small area of epidermal lysis inferior and lateral to the nipple on the left side but this is much smaller.  Her dressings have been in place since Saturday.  The tape is densely adherent to her skin and I believe this may have contributed to the damage though part of it appears to be ischemia of the skin.  I have redressed her incisions and asked her not to use tape over the top of the incisions any longer.  She will use pads or feminine hygiene pads with the tape adherent to the garment or her bra.  I will start her on antibiotics prophylactically.  I will set her up for follow-up appointments on Monday and Thursday next week just to ensure that she is doing well with her dressing changes and there is no further tissue loss.

## 2022-11-18 ENCOUNTER — Ambulatory Visit (INDEPENDENT_AMBULATORY_CARE_PROVIDER_SITE_OTHER): Payer: 59 | Admitting: Plastic Surgery

## 2022-11-18 VITALS — BP 137/79 | HR 109

## 2022-11-18 DIAGNOSIS — Z9889 Other specified postprocedural states: Secondary | ICD-10-CM

## 2022-11-18 DIAGNOSIS — S21001A Unspecified open wound of right breast, initial encounter: Secondary | ICD-10-CM

## 2022-11-18 NOTE — Progress Notes (Signed)
Hannah Barnett returns today for evaluation of her postoperative wounds.  Patient underwent a bilateral breast reduction and developed skin necrosis on the lower outer quadrant of the breast.  The nipples remain sensate and well-perfused.  The area of epidermal lysis is now what appears to be a full-thickness loss of the skin with the skin in place as a biologic dressing.  There is no evidence of infection and the drainage from the wound is minimal.   Will continue with dressing changes and she will follow-up with me on Thursday.  Will continue close follow-up until the eschar separates.

## 2022-11-20 ENCOUNTER — Encounter: Payer: Self-pay | Admitting: Plastic Surgery

## 2022-11-21 ENCOUNTER — Ambulatory Visit (INDEPENDENT_AMBULATORY_CARE_PROVIDER_SITE_OTHER): Payer: 59 | Admitting: Plastic Surgery

## 2022-11-21 VITALS — BP 109/64 | HR 91

## 2022-11-21 DIAGNOSIS — S21001A Unspecified open wound of right breast, initial encounter: Secondary | ICD-10-CM

## 2022-11-21 DIAGNOSIS — Z9889 Other specified postprocedural states: Secondary | ICD-10-CM

## 2022-11-21 NOTE — Progress Notes (Signed)
Hannah Barnett returns today for follow-up.  She states she is doing well and is having no pain and no fevers chills.  She has begun showering and is beginning to increase her activity.  On physical exam she has nice shape and size and there is no evidence of hematoma or seroma.  She does have a eschar on the right lower and medial breast which is beginning to harden and separate from the surrounding skin.  There is no evidence of infection and no drainage from around the incisions or around the eschar.  Will continue dressing changes.  Will follow the eschar as it separates.  Once it separates she will require dressing changes until the tissue underneath heals.  Will continue close follow-up with weekly visits.

## 2022-11-25 ENCOUNTER — Ambulatory Visit (INDEPENDENT_AMBULATORY_CARE_PROVIDER_SITE_OTHER): Payer: 59 | Admitting: Student

## 2022-11-25 VITALS — BP 134/66 | HR 88

## 2022-11-25 DIAGNOSIS — Z9889 Other specified postprocedural states: Secondary | ICD-10-CM

## 2022-11-25 MED ORDER — POLYETHYLENE GLYCOL 3350 17 GM/SCOOP PO POWD: 17.0000 g | Freq: Every day | ORAL | 0 refills | Status: AC

## 2022-11-25 NOTE — Progress Notes (Signed)
Patient is a 72 year old female who underwent bilateral breast reduction with Dr. Ladona Ridgel on 11/05/2022.  She presents to the clinic today for follow-up.  Patient was last seen in the clinic on 11/21/2022.  On exam, patient was noted to have nice shape and size of her breast.  There was eschar noted on the right lower medial breast which was beginning to harden and separated from the surrounding skin.  No evidence of infection or drainage on incisions.  Plan was for patient to continue dressing changes and follow along as eschar separates.  Today, patient reports she is doing well.  She states that she has had a little bit of drainage from her wounds to the breast bilaterally, denies any other issues or concerns with the surgical sites.  States that the skin overlying the breasts is a little dry.  Patient also states that she has had a little bit of constipation.  Denies any fevers or chills.  Chaperone present on exam.  On exam, patient is sitting upright in no acute distress.  Breasts are soft and have good symmetry.  NAC's are healthy bilaterally.  No overlying erythema to either the breast.  No obvious fluid collections on exam.  To the left breast, there is a 4 x 2 cm wound noted just lateral of the inferior T-zone.  There is some eschar noted centrally in the wound.  There is no surrounding erythema.  No active drainage on exam.  To the right breast, there is a 6 x 5 cm wound with eschar noted throughout.  There is no surrounding erythema or drainage.  No signs of infection on exam.  All other incisions are intact.  Dr. Ladona Ridgel also had the opportunity to examine the patient today.  Plan for be for patient to continue to keep the wounds clean and apply ABD pads over them.  Patient to continue to monitor the wounds as the eschar begins to separate from the wound.  Will consider Santyl at a later time.  Patient expressed understanding was in agreement the plan.  Discussed with patient that she may apply  Vaseline to her skin and the remainder of her incisions to help with her dryness.  Patient expressed understanding.  Encourage patient to increase her fiber intake and her water intake to help with her constipation.  Will also prescribe MiraLAX.  Discussed with patient to take Tylenol and ibuprofen if she is having pain and avoid any sort of narcotics.  Patient expressed understanding.  Patient to follow back up next week.  I instructed the patient to call in the meantime she has any questions or concerns about anything.  Pictures were obtained of the patient and placed in the chart with the patient's or guardian's permission.

## 2022-11-26 ENCOUNTER — Encounter: Payer: Self-pay | Admitting: Plastic Surgery

## 2022-11-27 ENCOUNTER — Encounter: Payer: Self-pay | Admitting: Plastic Surgery

## 2022-11-28 ENCOUNTER — Encounter: Payer: Self-pay | Admitting: Plastic Surgery

## 2022-11-28 ENCOUNTER — Telehealth: Payer: Self-pay | Admitting: Student

## 2022-11-28 NOTE — Telephone Encounter (Signed)
I called the patient in regards to her MyChart messages.  She states that she has been having pain from her left neck, left shoulder and down her left arm.  She denies any chest pain she denies any shortness of breath.  She denies any diaphoresis.  She denies any other symptoms.  She states that this pain was present prior to surgery, but was present on the right side.  She states now it is on the left.  She states that the tramadol is not helping.  She reports her breasts are not hurting.  I highly recommended that the patient be evaluated in an urgent care and emergency room to rule out any cardiac issues.  I discussed that the pain she is experiencing could be indicative of heart problems and that she needs to have that ruled out.  Patient expressed understanding and states she is going to try to get seen today.

## 2022-11-29 NOTE — Telephone Encounter (Signed)
Matt, I was hoping Alan Ripper would have addressed this patient since she had seen her recently, but since you are with Dr. Ladona Ridgel can you please discuss this with him. Thanks.

## 2022-12-04 ENCOUNTER — Encounter: Payer: Self-pay | Admitting: Plastic Surgery

## 2022-12-05 ENCOUNTER — Ambulatory Visit (INDEPENDENT_AMBULATORY_CARE_PROVIDER_SITE_OTHER): Payer: 59 | Admitting: Plastic Surgery

## 2022-12-05 VITALS — BP 139/84 | HR 81 | Wt 154.4 lb

## 2022-12-05 DIAGNOSIS — S21001A Unspecified open wound of right breast, initial encounter: Secondary | ICD-10-CM

## 2022-12-05 DIAGNOSIS — Z9889 Other specified postprocedural states: Secondary | ICD-10-CM

## 2022-12-05 NOTE — Progress Notes (Signed)
Ms. Heidbrink returns today approximately 4 weeks postop from a bilateral breast reduction.  She has 2 specific complaints today 1 is the pain in her neck radiating down her left arm.  The second is burning pain in her breast.  She states that she was seen in urgent care and that she was given pain medication for her neck and arm however this is not provided significant relief.  On evaluation the scars on the inferior poles of her breast have separated and now she has exposed tissue underneath.  There is no evidence of infection.  Evaluation of her back reveals no specific findings including no evidence of wounds or rashes consistent with shingles.  She does relate pain directly in the center of her neck which radiates down her left arm.  She is able to use her left arm.  She specifically denies chest pain.  The wounds were dressed with adaptic and a water based lubricant. She was instructed on how to care for her wounds and dressing supplies were requested from a local vendor. She will follow up in the office next Monday.  I do not have an explanation for her neck and arm pain.  I explained to her prior to her requested breast reduction that I doubted the reduction would be sufficient to relieve this pain.  She requested that I proceed anyways.  Her neck and back pain is likely due to pathology within her neck and I have strongly recommended that she follow-up with her primary care physician Dr. Caralee Ates for further evaluation and treatment of this pain.

## 2022-12-09 ENCOUNTER — Ambulatory Visit (INDEPENDENT_AMBULATORY_CARE_PROVIDER_SITE_OTHER): Payer: 59 | Admitting: Student

## 2022-12-09 ENCOUNTER — Encounter: Payer: Self-pay | Admitting: Student

## 2022-12-09 VITALS — BP 160/81 | HR 81

## 2022-12-09 DIAGNOSIS — Z9889 Other specified postprocedural states: Secondary | ICD-10-CM

## 2022-12-09 NOTE — Progress Notes (Signed)
Patient is a 72 year old female with history of macromastia.  She underwent bilateral breast reduction with Dr. Ladona Ridgel on 11/05/2022.  She is almost 5 weeks postop.  She presents to the clinic today for postoperative follow-up.  Patient was last seen in the clinic on 12/05/2022.  At this visit, patient reported that she had neck pain radiating down her left arm.  Patient also reported she had burning pain to her breast.  Patient did state that she was seen in an urgent care and given pain medication for her neck and arm, but it did not provide significant relief.  On exam, the scars on the inferior poles of her breasts have separated and now she had exposed tissue underneath.  There is no evidence of infection.  Wounds were dressed with Adaptic and K-Y jelly.  Patient was recommended to follow-up with her PCP in regards to her neck pain.  Today, patient reports she is still having pain to her breasts and to her left arm/shoulder.  Patient also states that she has not received prism supplies and has been using what we gave her at her last appointment.  Chaperone present on exam.  On exam, patient is sitting upright in no acute distress.  She is tearful.  To the right breast, wound is 8 x 7.5 x 0.5 cm.  There is eschar at overlying the wound.  This was removed, patient tolerated well.  There is no surrounding erythema or drainage.  Tissue within the wound appears to be healthy.  NAC appears to be viable.  Incision otherwise appears to be intact.  To the left breast, wound is 7.5 x 4 x 0.5 cm.  There is some eschar noted in the wound.  Otherwise tissue in the wound appears to be healthy.  There are no signs of infection on exam.  No fluid collections palpated on exam.  Recommended that patient apply Adaptic and K-Y jelly daily to her wounds.  We will check on the prism order to see if it can still be delivered.  Discussed with patient that if she is unable to get the Adaptic from [facility], she may apply Vaseline to  her wounds daily and cover with an ABD pad.  Patient expressed understanding.  I recommended to the patient that she take Tylenol and ibuprofen for her pain.  I also recommended that she follow-up with her PCP in regards to her shoulder and arm pain.  Patient expressed understanding.  We will have the patient follow back up early next week.  I instructed her to call in the meantime she is any questions or concerns.  Pictures were obtained of the patient and placed in the chart with the patient's or guardian's permission.   Dr. Ladona Ridgel was present during today's exam and had the opportunity to examine the patient as well.  He was in agreement with the plan.

## 2022-12-10 ENCOUNTER — Telehealth: Payer: Self-pay

## 2022-12-10 ENCOUNTER — Ambulatory Visit: Payer: 59 | Admitting: Family Medicine

## 2022-12-10 NOTE — Telephone Encounter (Signed)
Faxed wound supply order form with demographics, ins card and most recent ov notes to [facility]. Received correspondence from [facility] 8/19 @ 5:29 pm stating:   [facility] has provided service for the patient; no further action is required.   Forwarded all documents to front desk for batch scanning.

## 2022-12-11 NOTE — Progress Notes (Unsigned)
   Acute Office Visit  Subjective:     Patient ID: Hannah Barnett, female    DOB: Jul 25, 1950, 72 y.o.   MRN: [id number]  No chief complaint on file.   HPI Patient is in today for neck and left shoulder pain after breast reduction.     ROS      Objective:    There were no vitals taken for this visit. {Vitals History (Optional):23777}  Physical Exam  No results found for any visits on 12/12/22.      Assessment & Plan:   Problem List Items Addressed This Visit   None   No orders of the defined types were placed in this encounter.   No follow-ups on file.  Margarita Mail, DO

## 2022-12-12 ENCOUNTER — Encounter: Payer: Self-pay | Admitting: Internal Medicine

## 2022-12-12 ENCOUNTER — Telehealth: Payer: Self-pay

## 2022-12-12 ENCOUNTER — Ambulatory Visit (INDEPENDENT_AMBULATORY_CARE_PROVIDER_SITE_OTHER): Payer: 59 | Admitting: Internal Medicine

## 2022-12-12 VITALS — BP 110/64 | HR 100 | Temp 98.1°F | Resp 18 | Ht 61.5 in | Wt 154.1 lb

## 2022-12-12 DIAGNOSIS — M542 Cervicalgia: Secondary | ICD-10-CM

## 2022-12-12 MED ORDER — TIZANIDINE HCL 4 MG PO TABS
4.0000 mg | ORAL_TABLET | Freq: Every evening | ORAL | 0 refills | Status: DC | PRN
Start: 2022-12-12 — End: 2023-04-18

## 2022-12-12 MED ORDER — CELECOXIB 50 MG PO CAPS
50.0000 mg | ORAL_CAPSULE | Freq: Every day | ORAL | 1 refills | Status: DC | PRN
Start: 2022-12-12 — End: 2023-04-18

## 2022-12-12 NOTE — Telephone Encounter (Signed)
Pt notified about concerns of side effects to celebrex.  Dr. Caralee Ates, states it would be up to her to try for pain

## 2022-12-12 NOTE — Patient Instructions (Addendum)
It was great seeing you today!  Plan discussed at today's visit: -Take Celebrex 50 mg as needed with food but please avoid all other anti-inflammatories like Aleve, Advil. Ok to take Tylenol -Try Zanaflex muscle relaxer to take at night  -Stop Tramadol and Robaxin   Follow up in: November   Take care and let us know if you have any questions or concerns prior to your next visit.  Dr. Caralee Ates  Celecoxib Capsules What is this medication? CELECOXIB (sell a KOX ib) treats mild to moderate pain, inflammation, or arthritis. It belongs to a group of medications called NSAIDs. This medicine may be used for other purposes; ask your health care provider or pharmacist if you have questions. COMMON BRAND NAME(S): Celebrex What should I tell my care team before I take this medication? They need to know if you have any of these conditions: Bleeding disorder Coronary artery bypass graft (CABG) the past 2 weeks Frequently drink alcohol Heart attack Heart disease Heart failure High blood pressure High levels of potassium in your blood Kidney disease Liver disease Low red blood cell levels Lung or breathing disease, such as asthma Receiving steroids, such as dexamethasone or prednisone Stomach bleeding Stomach or intestine problems Take medications that treat or prevent blood clots Tobacco use An unusual or allergic reaction to celecoxib, other medications, foods, dyes, or preservatives Pregnant or trying to get pregnant Breastfeeding How should I use this medication? Take this medication by mouth with water. Take it as directed on the prescription label at the same time every day. Do not cut, crush, or chew this medication. Swallow the capsules whole. You may open the capsule and put the contents in 1 teaspoon of applesauce. Swallow the medication and applesauce right away. Do not chew the medication or applesauce. A special MedGuide will be given to you by the pharmacist with each  prescription and refill. Be sure to read this information carefully each time. Talk to your care team about the use of this medication in children. While it may be prescribed for children as young as 2 years for selected conditions, precautions do apply. People 65 years and older may have a stronger reaction and need a smaller dose. Overdosage: If you think you have taken too much of this medicine contact a poison control center or emergency room at once. NOTE: This medicine is only for you. Do not share this medicine with others. What if I miss a dose? If you miss a dose, take it as soon as you can. If it is almost time for your next dose, take only that dose. Do not take double or extra doses. What may interact with this medication? Do not take this medication with any of the following: Cidofovir Ketorolac Thioridazine This medication may also interact with the following: Alcohol Aspirin and aspirin-like medications Atomoxetine Certain medications for blood pressure, heart disease, irregular heartbeat Certain medications for mental health conditions Certain medications that treat or prevent blood clots, such as warfarin, enoxaparin, dalteparin, apixaban, dabigatran, and rivaroxaban Cyclosporine Digoxin Diuretics Fluconazole Lithium Methotrexate Other NSAIDs, medications for pain and inflammation, such as ibuprofen or naproxen Pemetrexed Rifampin Steroid medications, such as prednisone or cortisone This list may not describe all possible interactions. Give your health care provider a list of all the medicines, herbs, non-prescription drugs, or dietary supplements you use. Also tell them if you smoke, drink alcohol, or use illegal drugs. Some items may interact with your medicine. What should I watch for while using this medication? Visit  your care team for regular checks on your progress. Tell your care team if your symptoms do not start to get better or if they get worse. Do not  take other medications that contain aspirin, ibuprofen, or naproxen with this medication. Side effects such as stomach upset, nausea, or ulcers may be more likely to occur. Many non-prescription medications contain aspirin, ibuprofen, or naproxen. Always read labels carefully. This medication can cause serious ulcers and bleeding in the stomach. It can happen with no warning. Tobacco, alcohol, older age, and poor health can also increase risks. Call your care team right away if you have stomach pain or blood in your vomit or stool. This medication does not prevent a heart attack or stroke. This medication may increase the chance of a heart attack or stroke. The chance may increase the longer you use this medication or if you have heart disease. If you take aspirin to prevent a heart attack or stroke, talk to your care team about using this medication. This medication may cause serious skin reactions. They can happen weeks to months after starting the medication. Contact your care team right away if you notice fevers or flu-like symptoms with a rash. The rash may be red or purple and then turn into blisters or peeling of the skin. You may also notice a red rash with swelling of the face, lips, or lymph nodes in your neck or under your arms. Talk to your care team if you wish to become pregnant or think you might be pregnant. This medication can cause serious birth defects if used after 20 weeks of pregnancy. Your care team will monitor you closely if you need to take it between weeks 20 and 30 of pregnancy. It is not recommended to take this medication after 30 weeks of pregnancy. This medication may affect your coordination, reaction time, or judgment. Do not drive or operate machinery until you know how this medication affects you. Sit up or stand slowly to reduce the risk of dizzy or fainting spells. Drinking alcohol with this medication can increase the risk of these side effects. Be careful brushing or  flossing your teeth or using a toothpick because you may get an infection or bleed more easily. If you have any dental work done, tell your dentist you are receiving this medication. This medication may make it more difficult to get pregnant. Talk to your care team if you are concerned about your fertility. What side effects may I notice from receiving this medication? Side effects that you should report to your care team as soon as possible: Allergic reactions--skin rash, itching, hives, swelling of the face, lips, tongue, or throat Bleeding--bloody or black, tar-like stools, vomiting blood or brown material that looks like coffee grounds, red or dark brown urine, small red or purple spots on skin, unusual bruising or bleeding Heart attack--pain or tightness in the chest, shoulders, arms, or jaw, nausea, shortness of breath, cold or clammy skin, feeling faint or lightheaded Heart failure--shortness of breath, swelling of ankles, feet, or hands, sudden weight gain, unusual weakness or fatigue Increase in blood pressure Kidney injury--decrease in the amount of urine, swelling of the ankles, hands, or feet Liver injury--right upper belly pain, loss of appetite, nausea, light-colored stool, dark yellow or brown urine, yellowing skin or eyes, unusual weakness or fatigue Rash, fever, and swollen lymph nodes Redness, blistering, peeling, or loosening of the skin, including inside the mouth Stroke--sudden numbness or weakness of the face, arm, or leg, trouble speaking,  confusion, trouble walking, loss of balance or coordination, dizziness, severe headache, change in vision Side effects that usually do not require medical attention (report to your care team if they continue or are bothersome): Headache Loss of appetite Nausea Upset stomach This list may not describe all possible side effects. Call your doctor for medical advice about side effects. You may report side effects to FDA at  1-800-FDA-1088. Where should I keep my medication? Keep out of the reach of children and pets. Store at room temperature between 15 and 30 degrees C (59 and 86 degrees F). Get rid of any unused medication after the expiration date. To get rid of medications that are no longer needed or have expired: Take the medication to a medication take-back program. Check with your pharmacy or law enforcement to find a location. If you cannot return the medication, check the label or package insert to see if the medication should be thrown out in the garbage or flushed down the toilet. If you are not sure, ask your care team. If it is safe to put it in the trash, empty the medication out of the container. Mix the medication with cat litter, dirt, coffee grounds, or other unwanted substance. Seal the mixture in a bag or container. Put it in the trash. NOTE: This sheet is a summary. It may not cover all possible information. If you have questions about this medicine, talk to your doctor, pharmacist, or health care provider.  2024 Elsevier/Gold Standard (2021-12-30 00:00:00)

## 2022-12-16 ENCOUNTER — Encounter: Payer: Self-pay | Admitting: Plastic Surgery

## 2022-12-16 ENCOUNTER — Ambulatory Visit (INDEPENDENT_AMBULATORY_CARE_PROVIDER_SITE_OTHER): Payer: 59 | Admitting: Plastic Surgery

## 2022-12-16 VITALS — BP 147/76 | HR 78 | Wt 153.6 lb

## 2022-12-16 DIAGNOSIS — S21001A Unspecified open wound of right breast, initial encounter: Secondary | ICD-10-CM

## 2022-12-16 DIAGNOSIS — Z9889 Other specified postprocedural states: Secondary | ICD-10-CM

## 2022-12-16 NOTE — Progress Notes (Signed)
Hannah Barnett returns today for evaluation of her wounds.  She is doing much better and states that her pain has essentially resolved.  She states that she is also able to shower now and get the water on her breast without significant discomfort.  On evaluation both of the areas of skin necrosis on the lower poles of the breast are healing nicely.  There is 1 small piece of ischemic skin which I removed in the office today.  Otherwise there is good granulation tissue across both breast.  Dressing changes with surgical lube and Adaptic were done in the office.  Patient will continue these dressing changes as the wounds heal.  She is encouraged to shower and clean the wounds.  Follow-up next Monday.

## 2022-12-19 ENCOUNTER — Ambulatory Visit: Payer: 59

## 2022-12-24 ENCOUNTER — Encounter: Payer: 59 | Admitting: Student

## 2022-12-26 ENCOUNTER — Encounter: Payer: Self-pay | Admitting: Surgical

## 2022-12-26 ENCOUNTER — Ambulatory Visit (INDEPENDENT_AMBULATORY_CARE_PROVIDER_SITE_OTHER): Payer: 59 | Admitting: Surgical

## 2022-12-26 VITALS — BP 146/78 | HR 94 | Ht 63.0 in | Wt 155.4 lb

## 2022-12-26 DIAGNOSIS — S21001A Unspecified open wound of right breast, initial encounter: Secondary | ICD-10-CM

## 2022-12-26 DIAGNOSIS — Z9889 Other specified postprocedural states: Secondary | ICD-10-CM

## 2022-12-26 NOTE — Progress Notes (Signed)
Patient is a 72 year old female status post bilateral breast reduction, she does have operative wounds of the bilateral breasts which have been managed with Adaptic and surgical lubricant.  She had a breast reduction on 11/05/2022.  Patient reports she has been doing well, she has been doing Adaptic and K-Y jelly dressing changes.  She does not report any pain or discomfort today.  Chaperone present on exam On exam right breast wound with significant amount of new epithelialization noted.  She does have a few small islands of granulation tissue still exposed.  There is no surrounding erythema or cellulitic changes.  There is pigmentation noted throughout some of the new epithelium.  On exam of left breast wound, there is some mild amount of new epithelialization noted at the medial border.  Otherwise the majority of the wound bed is healthy granulation tissue.  No surrounding erythema.  Bilateral NAC's are viable. All other breast incisions are intact.  A/P:  Recommend continuing with Adaptic and K-Y jelly dressing changes to left breast wound. Recommend Vaseline and gauze dressing changes to right breast wound. We discussed that pigmentation will continue to return over the right breast new epithelium over the next few months.  We discussed following up in 2 weeks for reevaluation.  There is no signs of infection or concern on exam. Pictures were obtained of the patient and placed in the chart with the patient's or guardian's permission.

## 2022-12-30 ENCOUNTER — Other Ambulatory Visit: Payer: Self-pay | Admitting: Internal Medicine

## 2022-12-30 DIAGNOSIS — I10 Essential (primary) hypertension: Secondary | ICD-10-CM

## 2022-12-31 NOTE — Telephone Encounter (Signed)
Requested Prescriptions  Pending Prescriptions Disp Refills   amLODipine (NORVASC) 2.5 MG tablet [Pharmacy Med Name: AMLODIPINE BESYLATE 2.5 MG TAB] 90 tablet 0    Sig: Take 1 tablet (2.5 mg total) by mouth daily.     Cardiovascular: Calcium Channel Blockers 2 Failed - 12/30/2022 10:54 AM      Failed - Last BP in normal range    BP Readings from Last 1 Encounters:  12/26/22 (!) 146/78         Passed - Last Heart Rate in normal range    Pulse Readings from Last 1 Encounters:  12/26/22 94         Passed - Valid encounter within last 6 months    Recent Outpatient Visits           2 weeks ago Neck pain on left side   [facility] Margarita Mail, DO   2 months ago Pre-op evaluation   [facility] Margarita Mail, DO   3 months ago Essential hypertension   [facility] Margarita Mail, DO       Future Appointments             In 2 months Margarita Mail, DO [facility], [facility]

## 2023-01-03 ENCOUNTER — Ambulatory Visit (INDEPENDENT_AMBULATORY_CARE_PROVIDER_SITE_OTHER): Payer: 59

## 2023-01-03 ENCOUNTER — Ambulatory Visit: Payer: 59

## 2023-01-03 VITALS — Ht 63.0 in | Wt 153.0 lb

## 2023-01-03 DIAGNOSIS — Z0000 Encounter for general adult medical examination without abnormal findings: Secondary | ICD-10-CM

## 2023-01-03 NOTE — Progress Notes (Signed)
Subjective:   Hannah Barnett is a 72 y.o. female who presents for an Initial Medicare Annual Wellness Visit.  Visit Complete: Virtual  I connected with  Hannah Barnett on 01/03/23 by a audio enabled telemedicine application and verified that I am speaking with the correct person using two identifiers.  Patient Location: Home  Provider Location: Office/Clinic  I discussed the limitations of evaluation and management by telemedicine. The patient expressed understanding and agreed to proceed.  Vital Signs: Unable to obtain new vitals due to this being a telehealth visit.  Patient Medicare AWV questionnaire was completed by the patient on 01/03/23; I have confirmed that all information answered by patient is correct and no changes since this date.  Cardiac Risk Factors include: hypertension     Objective:    Today's Vitals   01/03/23 1414  Weight: 153 lb (69.4 kg)  Height: 5\' 3"  (1.6 m)   Body mass index is 27.1 kg/m.     01/03/2023    2:20 PM  Advanced Directives  Does Patient Have a Medical Advance Directive? No    Current Medications (verified) Outpatient Encounter Medications as of 01/03/2023  Medication Sig   amLODipine (NORVASC) 2.5 MG tablet Take 1 tablet (2.5 mg total) by mouth daily.   celecoxib (CELEBREX) 50 MG capsule Take 1 capsule (50 mg total) by mouth daily as needed for pain.   Cholecalciferol 50 MCG (2000 UT) CAPS Take by mouth.   Multiple Vitamin (MULTIVITAMIN WITH MINERALS) TABS tablet Take 1 tablet by mouth daily.   rosuvastatin (CRESTOR) 10 MG tablet Take 1 tablet (10 mg total) by mouth at bedtime.   tiZANidine (ZANAFLEX) 4 MG tablet Take 1 tablet (4 mg total) by mouth at bedtime as needed.   triamcinolone (NASACORT) 55 MCG/ACT AERO nasal inhaler SMARTSIG:Both Nares   No facility-administered encounter medications on file as of 01/03/2023.    Allergies (verified) Atorvastatin and Lisinopril   History: Past Medical History:  Diagnosis Date    Hyperlipidemia    Hypertension    Past Surgical History:  Procedure Laterality Date   BREAST REDUCTION SURGERY Bilateral 11/05/2022   Procedure: BILATERAL MAMMARY REDUCTION  (BREAST);  Surgeon: Santiago Glad, MD;  Location: [facility];  Service: Plastics;  Laterality: Bilateral;   CATARACT EXTRACTION Bilateral    Family History  Problem Relation Age of Onset   Dementia Mother    Cancer Father    Social History   Socioeconomic History   Marital status: Single    Spouse name: Not on file   Number of children: Not on file   Years of education: Not on file   Highest education level: Bachelor's degree (e.g., BA, AB, BS)  Occupational History   Not on file  Tobacco Use   Smoking status: Former    Types: Cigarettes   Smokeless tobacco: Never  Vaping Use   Vaping status: Never Used  Substance and Sexual Activity   Alcohol use: Never   Drug use: Never   Sexual activity: Not Currently  Other Topics Concern   Not on file  Social History Narrative   Pt will be using transportation service for transport to [facility] in [place]   Social Determinants of Health   Financial Resource Strain: Low Risk  (01/03/2023)   Overall Financial Resource Strain (CARDIA)    Difficulty of Paying Living Expenses: Not hard at all  Food Insecurity: No Food Insecurity (01/03/2023)   Hunger Vital Sign    Worried  About Running Out of Food in the Last Year: Never true    Ran Out of Food in the Last Year: Never true  Transportation Needs: No Transportation Needs (01/03/2023)   PRAPARE - Administrator, Civil Service (Medical): No    Lack of Transportation (Non-Medical): No  Physical Activity: Insufficiently Active (01/03/2023)   Exercise Vital Sign    Days of Exercise per Week: 2 days    Minutes of Exercise per Session: 10 min  Stress: No Stress Concern Present (01/03/2023)   [employer] of Occupational Health - Occupational Stress Questionnaire    Feeling of  Stress : Only a little  Recent Concern: Stress - Stress Concern Present (10/29/2022)   [employer] of Occupational Health - Occupational Stress Questionnaire    Feeling of Stress : To some extent  Social Connections: Socially Isolated (01/03/2023)   Social Connection and Isolation Panel [NHANES]    Frequency of Communication with Friends and Family: More than three times a week    Frequency of Social Gatherings with Friends and Family: More than three times a week    Attends Religious Services: Never    Database administrator or Organizations: No    Attends Engineer, structural: Never    Marital Status: Never married    Tobacco Counseling Counseling given: Not Answered   Clinical Intake:  Pre-visit preparation completed: Yes  Pain : No/denies pain     BMI - recorded: 27.1 Nutritional Risks: None Diabetes: No  How often do you need to have someone help you when you read instructions, pamphlets, or other written materials from your doctor or pharmacy?: 1 - Never  Interpreter Needed?: No  Comments: lives alone Information entered by :: B.Porscha Axley,LPN   Activities of Daily Living    01/03/2023   11:31 AM 12/30/2022    5:08 AM  In your present state of health, do you have any difficulty performing the following activities:  Hearing? 0 0  Vision? 0 0  Difficulty concentrating or making decisions? 0 0  Walking or climbing stairs? 0 0  Dressing or bathing? 0 0  Doing errands, shopping? 0 0  Preparing Food and eating ? N N  Using the Toilet? N N  In the past six months, have you accidently leaked urine? N N  Do you have problems with loss of bowel control? N N  Managing your Medications? N N  Managing your Finances? N N  Housekeeping or managing your Housekeeping? N N    Patient Care Team: Margarita Mail, DO as PCP - General (Internal Medicine)  Indicate any recent Medical Services you may have received from other than [facility] providers in the past year  (date may be approximate).     Assessment:   This is a routine wellness examination for [place].  Hearing/Vision screen Hearing Screening - Comments:: Pt says she has two hearing aids:hears well Vision Screening - Comments:: Pt says she wears glasses for readers and sees well;Dr Clydene Pugh says UTD with exams   Goals Addressed   None    Depression Screen    01/03/2023    2:18 PM 12/12/2022   10:48 AM 10/29/2022   11:35 AM 09/11/2022    9:21 AM  PHQ 2/9 Scores  PHQ - 2 Score 0 0 0 0  PHQ- 9 Score  0 0 0    Fall Risk    01/03/2023   11:31 AM 12/30/2022    5:08 AM 12/17/2022    9:23 AM  12/12/2022   10:47 AM 10/29/2022   11:35 AM  Fall Risk   Falls in the past year? 0 0 0 0 0  Number falls in past yr:    0 0  Injury with Fall?    0 0  Risk for fall due to : No Fall Risks      Follow up Education provided;Falls prevention discussed        MEDICARE RISK AT HOME: Medicare Risk at Home Any stairs in or around the home?: No Home free of loose throw rugs in walkways, pet beds, electrical cords, etc?: Yes Adequate lighting in your home to reduce risk of falls?: Yes Life alert?: No Use of a cane, walker or w/c?: No Grab bars in the bathroom?: No Shower chair or bench in shower?: No Elevated toilet seat or a handicapped toilet?: No  TIMED UP AND GO:  Was the test performed? No    Cognitive Function:        01/03/2023    2:26 PM  6CIT Screen  What Year? 0 points  What month? 0 points  What time? 0 points  Count back from 20 0 points  Months in reverse 0 points  Repeat phrase 0 points  Total Score 0 points    Immunizations Immunization History  Administered Date(s) Administered   Moderna Sars-Covid-2 Vaccination 11/02/2019, 12/03/2019, 10/06/2020   Td 03/20/1993   Tdap 10/01/2019    TDAP status: Up to date  Flu Vaccine status: Declined, Education has been provided regarding the importance of this vaccine but patient still declined. Advised may receive this vaccine at  local pharmacy or Health Dept. Aware to provide a copy of the vaccination record if obtained from local pharmacy or Health Dept. Verbalized acceptance and understanding.  Pneumococcal vaccine status: Declined,  Education has been provided regarding the importance of this vaccine but patient still declined. Advised may receive this vaccine at local pharmacy or Health Dept. Aware to provide a copy of the vaccination record if obtained from local pharmacy or Health Dept. Verbalized acceptance and understanding.   Covid-19 vaccine status: Completed vaccines  Qualifies for Shingles Vaccine? Yes   Zostavax completed No   Shingrix Completed?: No.    Education has been provided regarding the importance of this vaccine. Patient has been advised to call insurance company to determine out of pocket expense if they have not yet received this vaccine. Advised may also receive vaccine at local pharmacy or Health Dept. Verbalized acceptance and understanding.  Screening Tests Health Maintenance  Topic Date Due   Zoster Vaccines- Shingrix (1 of 2) Never done   Lung Cancer Screening  Never done   DEXA SCAN  Never done   INFLUENZA VACCINE  Never done   COVID-19 Vaccine (4 - 2023-24 season) 12/22/2022   Pneumonia Vaccine 41+ Years old (1 of 1 - PCV) 09/11/2023 (Originally 09/27/2016)   Medicare Annual Wellness (AWV)  01/03/2024   MAMMOGRAM  08/06/2024   Colonoscopy  03/06/2031   Hepatitis C Screening  Completed   HPV VACCINES  Aged Out   DTaP/Tdap/Td  Discontinued    Health Maintenance  Health Maintenance Due  Topic Date Due   Zoster Vaccines- Shingrix (1 of 2) Never done   Lung Cancer Screening  Never done   DEXA SCAN  Never done   INFLUENZA VACCINE  Never done   COVID-19 Vaccine (4 - 2023-24 season) 12/22/2022    Colorectal cancer screening: Type of screening: Colonoscopy. Completed yes. Repeat every 10 years  Mammogram status: Completed yes. Repeat every year  Bone Density status: Completed  yes. Results reflect: Bone density results: NORMAL. Repeat every 5 years.  Lung Cancer Screening: (Low Dose CT Chest recommended if Age 38-80 years, 20 pack-year currently smoking OR have quit w/in 15years.) does not qualify.   Lung Cancer Screening Referral: no  Additional Screening:  Hepatitis C Screening: does not qualify; Completed yes  Vision Screening: Recommended annual ophthalmology exams for early detection of glaucoma and other disorders of the eye. Is the patient up to date with their annual eye exam?  Yes  Who is the provider or what is the name of the office in which the patient attends annual eye exams? Dr Clydene Pugh If pt is not established with a provider, would they like to be referred to a provider to establish care? No .   Dental Screening: Recommended annual dental exams for proper oral hygiene  Diabetic Foot Exam: n/a  Community Resource Referral / Chronic Care Management: CRR required this visit?  No   CCM required this visit?  No     Plan:     I have personally reviewed and noted the following in the patient's chart:   Medical and social history Use of alcohol, tobacco or illicit drugs  Current medications and supplements including opioid prescriptions. Patient is not currently taking opioid prescriptions. Functional ability and status Nutritional status Physical activity Advanced directives List of other physicians Hospitalizations, surgeries, and ER visits in previous 12 months Vitals Screenings to include cognitive, depression, and falls Referrals and appointments  In addition, I have reviewed and discussed with patient certain preventive protocols, quality metrics, and best practice recommendations. A written personalized care plan for preventive services as well as general preventive health recommendations were provided to patient.     Sue Lush, LPN   1/61/0960   After Visit Summary: (MyChart) Due to this being a telephonic visit,  the after visit summary with patients personalized plan was offered to patient via MyChart   Nurse Notes: The patient states she is doing well and has no concerns or questions at this time.

## 2023-01-03 NOTE — Patient Instructions (Signed)
Ms. Rabelo , Thank you for taking time to come for your Medicare Wellness Visit. I appreciate your ongoing commitment to your health goals. Please review the following plan we discussed and let me know if I can assist you in the future.   Referrals/Orders/Follow-Ups/Clinician Recommendations: none  This is a list of the screening recommended for you and due dates:  Health Maintenance  Topic Date Due   Zoster (Shingles) Vaccine (1 of 2) Never done   Screening for Lung Cancer  Never done   DEXA scan (bone density measurement)  Never done   Flu Shot  Never done   COVID-19 Vaccine (4 - 2023-24 season) 12/22/2022   Pneumonia Vaccine (1 of 1 - PCV) 09/11/2023*   Medicare Annual Wellness Visit  01/03/2024   Mammogram  08/06/2024   Colon Cancer Screening  03/06/2031   Hepatitis C Screening  Completed   HPV Vaccine  Aged Out   DTaP/Tdap/Td vaccine  Discontinued  *Topic was postponed. The date shown is not the original due date.    Advanced directives: (Declined) Advance directive discussed with you today. Even though you declined this today, please call our office should you change your mind, and we can give you the proper paperwork for you to fill out.  Next Medicare Annual Wellness Visit scheduled for next year: Yes 01/09/2024 @ 1:40pm telephone

## 2023-01-05 ENCOUNTER — Encounter: Payer: Self-pay | Admitting: Internal Medicine

## 2023-01-08 ENCOUNTER — Encounter: Payer: Self-pay | Admitting: Surgical

## 2023-01-08 ENCOUNTER — Ambulatory Visit (INDEPENDENT_AMBULATORY_CARE_PROVIDER_SITE_OTHER): Payer: 59 | Admitting: Surgical

## 2023-01-08 VITALS — BP 130/75 | HR 88 | Ht 63.0 in | Wt 156.6 lb

## 2023-01-08 DIAGNOSIS — S21001A Unspecified open wound of right breast, initial encounter: Secondary | ICD-10-CM

## 2023-01-08 DIAGNOSIS — Z9889 Other specified postprocedural states: Secondary | ICD-10-CM

## 2023-01-08 NOTE — Progress Notes (Signed)
72 year old female here for follow-up for evaluation of bilateral breast wounds that occurred after bilateral breast reduction.  She has been managing the wounds with Adaptic and surgical lubricant/K-Y jelly.  She is approximate 2 months postop.  She reports she has been doing Vaseline on the right breast wound and Adaptic and K-Y jelly in the left breast wound.  She reports she has been putting a lot of Vaseline on the right breast wound.  Chaperone present on exam On exam of the right breast wound, significant epithelialization noted.  Hyperpigmentation is beginning to occur.  She does have a small island of granulation tissue noted on the inferior aspect.  There is no surrounding erythema or cellulitic changes.  She does have some white residue noted on the superior portion of the wound, suspect this is dried Vaseline.  On exam of the left breast wound, significant amount of new epithelialization is noted.  There is no surrounding erythema or cellulitic changes noted.  The wound base is 95% granulation tissue.  A/P:  Patient is doing well, recommend continuing with Adaptic and K-Y jelly to left breast wound.  Recommend thin amount of Vaseline to right breast wound.  Recommend keeping the area clean.  There is no signs infection or concern on exam.  Pictures were obtained of the patient and placed in the chart with the patient's or guardian's permission.  Follow-up in 2 weeks.

## 2023-01-15 ENCOUNTER — Encounter: Payer: 59 | Admitting: Surgical

## 2023-01-16 DIAGNOSIS — M25512 Pain in left shoulder: Secondary | ICD-10-CM | POA: Insufficient documentation

## 2023-01-16 DIAGNOSIS — M25511 Pain in right shoulder: Secondary | ICD-10-CM | POA: Insufficient documentation

## 2023-01-22 ENCOUNTER — Encounter: Payer: 59 | Admitting: Surgical

## 2023-01-24 ENCOUNTER — Encounter: Payer: Self-pay | Admitting: Student

## 2023-01-24 ENCOUNTER — Ambulatory Visit (INDEPENDENT_AMBULATORY_CARE_PROVIDER_SITE_OTHER): Payer: 59 | Admitting: Student

## 2023-01-24 VITALS — BP 145/74 | HR 82 | Ht 63.0 in | Wt 157.2 lb

## 2023-01-24 DIAGNOSIS — Z9889 Other specified postprocedural states: Secondary | ICD-10-CM

## 2023-01-24 NOTE — Progress Notes (Signed)
Patient is a 72 year old female who underwent bilateral breast reduction with Dr. Ladona Ridgel on 11/05/2022.  Patient unfortunately developed bilateral breast wounds and has been following up with [place] for management of the wounds.  Patient presents to the clinic today for further follow-up.  Patient was last seen in the clinic on 01/08/2023.  At this visit, patient reported that she was doing Vaseline to the right breast and Adaptic and K-Y jelly to the left breast.  On exam, there is significant epithelialization noted.  Hyperpigmentation began to occur.  There is a [address] of granulation tissue to the inferior aspect.  Left breast wound was noted to have significant epithelialization as well.  The wound base was 95% granulation tissue.  It was recommended to the patient that she continue with Adaptic and K-Y jelly to the left breast wound and Vaseline to the right breast wound.  Today, patient reports she is doing well.  She states that she has not been applying anything to her breast wounds.  She otherwise denies any concerns or complaints.  She denies any fevers or chills.  Chaperone present on exam.  On exam, patient is sitting upright in no acute distress.  Breasts are symmetric and overall soft bilaterally.  There is some firmness noted to the right lateral breast that is consistent with some scarring versus fat necrosis.  There is no overlying erythema or skin changes.  NAC's appear to be healthy.  Wound to the right lower breast measures 5-1/2 x 5 and half centimeters.  It is almost completely epithelialized with the exception of a 0.5 x 0.5 cm area.  There is some pigment noted to be coming through as well.  To the left breast, the wound is approximately 2.5 x 0.5 cm, and the total affected area is about 4 x 1.5 cm.  There is epithelialization noted throughout except for that area of 2.5 x 0.5 cm.  There is no surrounding erythema or drainage.  No signs of infection on exam.  Remainder of incisions  appear to be well-healed.  Discussed with patient she may apply Vaseline to her wounds daily.  Discussed with her that she should also massage her breasts at least twice a day, especially the area of firmness to her right breast.  Patient expressed understanding.  Patient to follow back up in a few weeks.  I instructed the patient to call in the meantime she has any questions or concerns about anything.  Pictures were obtained of the patient and placed in the chart with the patient's or guardian's permission.

## 2023-02-07 ENCOUNTER — Encounter: Payer: Self-pay | Admitting: Internal Medicine

## 2023-02-17 ENCOUNTER — Ambulatory Visit (INDEPENDENT_AMBULATORY_CARE_PROVIDER_SITE_OTHER): Payer: 59 | Admitting: Plastic Surgery

## 2023-02-17 ENCOUNTER — Encounter: Payer: Self-pay | Admitting: Plastic Surgery

## 2023-02-17 VITALS — BP 144/81 | HR 79

## 2023-02-17 DIAGNOSIS — S21001A Unspecified open wound of right breast, initial encounter: Secondary | ICD-10-CM | POA: Diagnosis not present

## 2023-02-17 DIAGNOSIS — Z9889 Other specified postprocedural states: Secondary | ICD-10-CM

## 2023-02-17 NOTE — Progress Notes (Signed)
Ms. Specker returns today for follow-up.  She underwent a bilateral breast reduction July 16.  The procedure was complicated by wound breakdown on the lower poles of both breasts.  She has not been undergoing dressing changes since that time.  She seems to be healing well however today she notes some erythema on the lower aspect of the right breast.  She felt like this may be related to the Adaptic that she was using.  On examination there is no tenderness no drainage no fluctuance.  The wounds appear to be healing well and Embrey epithelializing.  There is a mild erythema.  I cleaned the skin with Vashe and used Vaseline over the epithelializing surfaces.  Will have the patient use just Vaseline from here on out.  Will have her follow-up in 1 week to ensure that there is no further worsening of her symptoms.

## 2023-02-21 ENCOUNTER — Encounter: Payer: Self-pay | Admitting: Internal Medicine

## 2023-02-24 ENCOUNTER — Ambulatory Visit: Payer: Self-pay

## 2023-02-24 NOTE — Telephone Encounter (Signed)
Summary: Rashes   Pt is calling in because she has rashes on her arm and wants advice on what to do about the rashes.     This encounter was created in error - please disregard. Pt already spoke to Triage Nurse.

## 2023-02-24 NOTE — Telephone Encounter (Signed)
Chief Complaint: Rash Symptoms: itchy bilateral wrist rash, bumps Frequency: constant  Pertinent Negatives: Patient denies redness, fever, nausea, vomiting Disposition: [] ED /[] Urgent Care (no appt availability in office) / [x] Appointment(In office/virtual)/ []  [facility]/ [] Home Care/ [] Refused Recommended Disposition /[] [facility]/ []  Follow-up with PCP Additional Notes: Patient states she has had a bilateral wrist rash for about 1 week. Patient reports it is very itchy but could not describe how big the rash was on the wrist. Patient denies using any over the counter medication for treatment. Per MyChart message sent patient will need an appointment for evaluation of the rash, patient requesting a referral to dermatology. Care advice was given and patient was offered an appointment today but patient stated she can not come to an appointment until Thursday. Patient has been scheduled Thursday at 10am.  Reason for Disposition  Localized rash present > 7 days  Answer Assessment - Initial Assessment Questions 1. APPEARANCE of RASH: "Describe the rash."      Bumps  2. LOCATION: "Where is the rash located?"      Bilateral wrist  3. NUMBER: "How many spots are there?"      2 4. SIZE: "How big are the spots?" (Inches, centimeters or compare to size of a coin)      I'm not sure  5. ONSET: "When did the rash start?"      About 1 week ago  6. ITCHING: "Does the rash itch?" If Yes, ask: "How bad is the itch?"  (Scale 0-10; or none, mild, moderate, severe)     Moderate to Severe  7. PAIN: "Does the rash hurt?" If Yes, ask: "How bad is the pain?"  (Scale 0-10; or none, mild, moderate, severe)    - NONE (0): no pain    - MILD (1-3): doesn't interfere with normal activities     - MODERATE (4-7): interferes with normal activities or awakens from sleep     - SEVERE (8-10): excruciating pain, unable to do any normal activities     No pain 8. OTHER SYMPTOMS: "Do you have any  other symptoms?" (e.g., fever)     No  Protocols used: Rash or Redness - Localized-A-AH

## 2023-02-26 ENCOUNTER — Encounter: Payer: Self-pay | Admitting: Student

## 2023-02-26 ENCOUNTER — Ambulatory Visit (INDEPENDENT_AMBULATORY_CARE_PROVIDER_SITE_OTHER): Payer: 59 | Admitting: Student

## 2023-02-26 VITALS — BP 128/78 | HR 95

## 2023-02-26 DIAGNOSIS — Z9889 Other specified postprocedural states: Secondary | ICD-10-CM

## 2023-02-26 DIAGNOSIS — S21002D Unspecified open wound of left breast, subsequent encounter: Secondary | ICD-10-CM | POA: Diagnosis not present

## 2023-02-26 DIAGNOSIS — S21001D Unspecified open wound of right breast, subsequent encounter: Secondary | ICD-10-CM | POA: Diagnosis not present

## 2023-02-26 NOTE — Progress Notes (Signed)
   Referring Provider Margarita Mail, DO [address] [place],  [place] [address]   CC:  Chief Complaint  Patient presents with   Post-op Follow-up      Hannah Barnett is an 72 y.o. female.  HPI:   Patient is a 72 year old female who underwent bilateral breast reduction with Dr. Ladona Ridgel on 11/05/2022.  Patient unfortunately developed bilateral breast wounds and has been following up with [place] for management of the wounds.  Patient presents to the clinic today for further follow-up     She was last seen in the clinic on 02/17/2023.  At this visit, patient appeared to be healing well, but there was some erythema on the lower aspects of the right breast.  On exam, there is no tenderness, no drainage no fluctuance.  The wounds appear to be healing well and epithelializing.  There was some mild erythema.  Plan is for patient to just apply Vaseline to the wound and have her follow-up in 1 week.  Today, patient is doing well.  She denies any new issues or concerns.  She denies any increasing redness to her wounds, fevers, chills or drainage.  She denies any increased pain.  She believes that the redness has improved after stopping the Adaptic.  Review of Systems General: Denies any fevers or chills  Physical Exam    02/26/2023   10:36 AM 02/17/2023    1:34 PM 01/24/2023    9:25 AM  Vitals with BMI  Height   5\' 3"   Weight   157 lbs 3 oz  BMI   27.85  Systolic 128 144 782  Diastolic 78 81 74  Pulse 95 79 82    General:  No acute distress,  Alert and oriented, Non-Toxic, Normal speech and affect Chaperone present on exam.  On exam, patient is sitting upright in no acute distress.  Breasts are symmetric.  They are overall soft, but there is some firmness noted to the upper right lateral aspect of the right breast.  There is no overlying skin changes.  This does appear to be consistent with some fat necrosis.  Left breast is soft.  Wounds appear to be healing well  bilaterally.  There does appear to be some epithelialization as well as repigmentation noted, especially to the wound to the right breast.  There is no significant erythema, tenderness to palpation, fluid collections on exam, or drainage noted on exam.  No active signs of infection on exam.   Assessment/Plan  S/P bilateral breast reduction   Recommended patient continue Vaseline to her wounds daily.  Recommend that she monitor the firmness to her right breast.  Discussed with her to massage this at least twice a day, but if she notices any increase in the size of the firmness, any overlying skin changes or has any concerns regarding this area, she should call us.  Patient expressed understanding.  Discussed with her that if firmness still persists, we may consider imaging at her next appointment.  Patient expressed understanding.  Patient knows to call us back sooner if she has any questions or concerns.  Patient to follow back up in 1 month.  Pictures were obtained of the patient and placed in the chart with the patient's or guardian's permission.   Laurena Spies 02/26/2023, 11:53 AM

## 2023-02-27 ENCOUNTER — Telehealth: Payer: Self-pay | Admitting: Internal Medicine

## 2023-02-27 ENCOUNTER — Ambulatory Visit (INDEPENDENT_AMBULATORY_CARE_PROVIDER_SITE_OTHER): Payer: 59 | Admitting: Physician Assistant

## 2023-02-27 ENCOUNTER — Encounter: Payer: Self-pay | Admitting: Physician Assistant

## 2023-02-27 VITALS — BP 118/72 | HR 86 | Temp 98.2°F | Resp 16 | Ht 61.5 in | Wt 162.6 lb

## 2023-02-27 DIAGNOSIS — R21 Rash and other nonspecific skin eruption: Secondary | ICD-10-CM | POA: Diagnosis not present

## 2023-02-27 MED ORDER — DESONIDE 0.05 % EX CREA: TOPICAL_CREAM | Freq: Two times a day (BID) | CUTANEOUS | 0 refills | Status: DC

## 2023-02-27 NOTE — Telephone Encounter (Signed)
Pt called in states her insurance, wont cover the med, desonide (DESOWEN) 0.05 % cream . Please cb for other alternatives

## 2023-02-27 NOTE — Progress Notes (Signed)
Acute Office Visit   Patient: Hannah Barnett   DOB: 12/29/1950   72 y.o. Female  MRN: [id number] Visit Date: 02/27/2023  Today's healthcare provider: Oswaldo Conroy Bairon Klemann, PA-C  Introduced myself to the patient as a Secondary school teacher and provided education on APPs in clinical practice.    Chief Complaint  Patient presents with   Rash    Bilateral wrist onset for weeks, itches/bumps   Subjective    HPI HPI     Rash    Additional comments: Bilateral wrist onset for weeks, itches/bumps      Last edited by Forde Radon, CMA on 02/27/2023 10:06 AM.       Addison Bailey Duration:  weeks  Location: hands  Itching: yes Burning: yes- sometimes  Redness: yes Oozing: no Scaling: no Blisters: no Painful: no Fevers: no Change in detergents/soaps/personal care products: no She uses Cetaphil on her hands and face  Recent illness: no Recent travel:no History of same: no Context: stable Alleviating factors: nothing- mild relief from moisturizers  Treatments attempted:lotion/moisturizer Shortness of breath: no  Throat/tongue swelling: no Myalgias/arthralgias: no    Medications: Outpatient Medications Prior to Visit  Medication Sig   amLODipine (NORVASC) 2.5 MG tablet Take 1 tablet (2.5 mg total) by mouth daily.   celecoxib (CELEBREX) 50 MG capsule Take 1 capsule (50 mg total) by mouth daily as needed for pain.   Cholecalciferol 50 MCG (2000 UT) CAPS Take by mouth.   Multiple Vitamin (MULTIVITAMIN WITH MINERALS) TABS tablet Take 1 tablet by mouth daily.   rosuvastatin (CRESTOR) 10 MG tablet Take 1 tablet (10 mg total) by mouth at bedtime.   tiZANidine (ZANAFLEX) 4 MG tablet Take 1 tablet (4 mg total) by mouth at bedtime as needed.   triamcinolone (NASACORT) 55 MCG/ACT AERO nasal inhaler SMARTSIG:Both Nares   No facility-administered medications prior to visit.    Review of Systems      Objective    BP 118/72   Pulse 86   Temp 98.2 F (36.8 C) (Oral)   Resp 16   Ht  5' 1.5" (1.562 m)   Wt 162 lb 9.6 oz (73.8 kg)   SpO2 97%   BMI 30.23 kg/m     Physical Exam Vitals reviewed.  Constitutional:      General: She is awake.     Appearance: Normal appearance. She is well-developed and well-groomed.  HENT:     Head: Normocephalic and atraumatic.  Eyes:     Extraocular Movements: Extraocular movements intact.     Conjunctiva/sclera: Conjunctivae normal.     Pupils: Pupils are equal, round, and reactive to light.  Pulmonary:     Effort: Pulmonary effort is normal.  Musculoskeletal:     Cervical back: Normal range of motion.  Skin:    Capillary Refill: Capillary refill takes more than 3 seconds.     Findings: Erythema and rash present. Rash is macular and papular. Rash is not purpuric, scaling or vesicular.     Comments: Fine Maculopapular rash along dorsal aspects of hands and wrists    Neurological:     Mental Status: She is alert.  Psychiatric:        Mood and Affect: Mood normal.        Behavior: Behavior normal. Behavior is cooperative.        Thought Content: Thought content normal.        Judgment: Judgment normal.       No results found for  any visits on 02/27/23.  Assessment & Plan      No follow-ups on file.      Problem List Items Addressed This Visit   None Visit Diagnoses     Rash and nonspecific skin eruption    -  Primary   Relevant Medications   desonide (DESOWEN) 0.05 % cream      Acute, new concern Patient reports ongoing maculopapular rash on bilateral wrists Recommend starting a second-generation antihistamine to assist with itching Reviewed hypoallergenic, fragrance free recommendations for topicals and detergents Will provide prescription for DesOwen cream to assist with itching.  Recommend that she does not use this for longer than 2 weeks to prevent adverse reaction Follow-up as needed for progressing or persistent symptoms   No follow-ups on file.   I, Yunus Stoklosa E Gilberto Streck, PA-C, have reviewed all  documentation for this visit. The documentation on 02/27/23 for the exam, diagnosis, procedures, and orders are all accurate and complete.   Jacquelin Hawking, MHS, PA-C [facility]

## 2023-02-27 NOTE — Patient Instructions (Signed)
I also recommend adding an antihistamine to your daily regimen This includes medications like Claritin, Allegra, Zyrtec- the generics of these work very well and are usually less expensive  I have also sent in a script for a steroid cream to help with the itching - please use this as needed up to 2 times per day. I do not recommend using it for more than 2 weeks on the same area as this could cause skin thinning.  Limit your moisturizers, soaps and detergents to those formulated for sensitive skin and that are fragrance free. You can continue to use Cetaphil and Vaseline/ Aquaphor

## 2023-02-28 ENCOUNTER — Other Ambulatory Visit: Payer: Self-pay | Admitting: Physician Assistant

## 2023-02-28 DIAGNOSIS — R21 Rash and other nonspecific skin eruption: Secondary | ICD-10-CM

## 2023-02-28 MED ORDER — TRIAMCINOLONE ACETONIDE 0.1 % EX CREA
1.0000 | TOPICAL_CREAM | Freq: Two times a day (BID) | CUTANEOUS | 0 refills | Status: DC
Start: 2023-02-28 — End: 2023-03-14

## 2023-03-11 NOTE — Progress Notes (Signed)
Established Patient Office Visit  Subjective   Patient ID: Hannah Barnett, female    DOB: August 07, 1950  Age: 72 y.o. MRN: [id number]  Chief Complaint  Patient presents with   Follow-up    HPI  Patient is here to follow up on chronic medical conditions. Having to take up in the middle of the night to urinate. Trying to reduce fluid intake at night. Usually goes to bed at 10 pm, wakes up at 4 am to urinate and has a hard time going back to sleep. No dysuria, hematuria, flank pain, fevers. No bladder spasms or overactive bladder symptoms during the day. No urinary incontinence.   Hypertension: -Medications: Amlodipine 2.5 mg  -Patient is compliant with above medications and reports no side effects. -Checking BP at home (average): 120-140/70-90 -Denies any SOB, CP, vision changes, LE edema or symptoms of hypotension  HLD: -Medications: Crestor 10 mg -Patient is compliant with above medications and reports no side effects.  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   Essential hypertension 12/03/2019   Chronic right shoulder pain 10/10/2015   History of tobacco use 04/10/2013   Hypercholesteremia 02/29/2012   Allergic rhinitis 12/18/2009   Past Medical History:  Diagnosis Date   Hyperlipidemia    Hypertension    Past Surgical History:  Procedure Laterality Date   BREAST REDUCTION SURGERY Bilateral 11/05/2022   Procedure: BILATERAL MAMMARY REDUCTION  (BREAST);  Surgeon: Santiago Glad, MD;  Location: [facility];  Service: Plastics;  Laterality: Bilateral;   CATARACT EXTRACTION Bilateral    Social History   Tobacco Use   Smoking status: Former    Types: Cigarettes   Smokeless tobacco: Never  Vaping Use   Vaping status: Never Used  Substance Use Topics   Alcohol use: Never   Drug use: Never   Social History   Socioeconomic History   Marital status: Single    Spouse name: Not on file   Number of children: Not on file   Years of education: Not on file   Highest education level: Bachelor's degree (e.g., BA, AB, BS)  Occupational History   Not on file  Tobacco Use   Smoking status: Former    Types: Cigarettes   Smokeless tobacco: Never  Vaping Use   Vaping status: Never Used  Substance and Sexual Activity   Alcohol use: Never   Drug use: Never   Sexual activity: Not Currently  Other Topics Concern   Not on file  Social History Narrative   Pt will be using transportation service for transport to [facility] in [place]   Social Determinants of Health   Financial Resource Strain: Low Risk  (02/24/2023)   Overall Financial Resource Strain (CARDIA)    Difficulty of Paying Living Expenses: Not hard at all  Food Insecurity: No Food Insecurity (02/24/2023)   Hunger Vital Sign    Worried About Running Out of Food in the Last Year:  Never true    Ran Out of Food in the Last Year: Never true  Transportation Needs: No Transportation Needs (02/24/2023)   PRAPARE - Administrator, Civil Service (Medical): No    Lack of Transportation (Non-Medical): No  Physical Activity: Insufficiently Active (02/24/2023)   Exercise Vital Sign    Days of Exercise per Week: 2 days    Minutes of Exercise per Session: 10 min  Stress: No Stress Concern Present (02/24/2023)   [employer] of Occupational Health - Occupational Stress Questionnaire    Feeling of Stress : Only a little  Social Connections: Socially Isolated (02/24/2023)    Social Connection and Isolation Panel [NHANES]    Frequency of Communication with Friends and Family: More than three times a week    Frequency of Social Gatherings with Friends and Family: More than three times a week    Attends Religious Services: Never    Database administrator or Organizations: No    Attends Banker Meetings: Never    Marital Status: Never married  Intimate Partner Violence: Not At Risk (01/03/2023)   Humiliation, Afraid, Rape, and Kick questionnaire    Fear of Current or Ex-Partner: No    Emotionally Abused: No    Physically Abused: No    Sexually Abused: No   Family Status  Relation Name Status   Mother  Deceased   Father  Deceased  No partnership data on file   Family History  Problem Relation Age of Onset   Dementia Mother    Cancer Father    Allergies  Allergen Reactions   Atorvastatin Other (See Comments)    Severe myalgia   Lisinopril     DIZZINESS      Review of Systems  Constitutional:  Negative for chills and fever.  Respiratory:  Negative for shortness of breath.   Cardiovascular:  Negative for chest pain.  Genitourinary:  Negative for dysuria, frequency, hematuria and urgency.      Objective:     BP 128/84   Pulse 87   Temp 98.1 F (36.7 C)   Resp 18   Ht 5' 1.5" (1.562 m)   Wt 160 lb 8 oz (72.8 kg)   SpO2 98%   BMI 29.84 kg/m  BP Readings from Last 3 Encounters:  03/14/23 128/84  02/27/23 118/72  02/26/23 128/78   Wt Readings from Last 3 Encounters:  03/14/23 160 lb 8 oz (72.8 kg)  02/27/23 162 lb 9.6 oz (73.8 kg)  01/24/23 157 lb 3.2 oz (71.3 kg)      Physical Exam Constitutional:      Appearance: Normal appearance.  HENT:     Head: Normocephalic and atraumatic.     Mouth/Throat:     Mouth: Mucous membranes are moist.     Pharynx: Oropharynx is clear.  Eyes:     Extraocular Movements: Extraocular movements intact.     Conjunctiva/sclera: Conjunctivae normal.     Pupils: Pupils are equal,  round, and reactive to light.  Cardiovascular:     Rate and Rhythm: Normal rate and regular rhythm.  Pulmonary:     Effort: Pulmonary effort is normal.     Breath sounds: Normal breath sounds.  Musculoskeletal:     Right lower leg: No edema.     Left lower leg: No edema.  Skin:    General: Skin is warm and dry.  Neurological:     General: No focal deficit present.     Mental Status: She  is alert. Mental status is at baseline.  Psychiatric:        Mood and Affect: Mood normal.        Behavior: Behavior normal.      No results found for any visits on 03/14/23.   Last CBC Lab Results  Component Value Date   WBC 5.4 10/29/2022   HGB 12.7 10/29/2022   HCT 37.8 10/29/2022   MCV 79.2 (L) 10/29/2022   MCH 26.6 (L) 10/29/2022   RDW 15.4 (H) 10/29/2022   PLT 394 10/29/2022   Last metabolic panel Lab Results  Component Value Date   GLUCOSE 86 09/11/2022   NA 142 09/11/2022   K 4.7 09/11/2022   CL 105 09/11/2022   CO2 28 09/11/2022   BUN 10 09/11/2022   CREATININE 0.73 09/11/2022   GFRNONAA >60 10/31/2012   CALCIUM 9.8 09/11/2022   PROT 7.0 09/11/2022   ALBUMIN 3.8 10/31/2012   BILITOT 0.4 09/11/2022   ALKPHOS 89 10/31/2012   AST 15 09/11/2022   ALT 15 09/11/2022   ANIONGAP 8 10/31/2012   Last lipids Lab Results  Component Value Date   CHOL 192 09/11/2022   HDL 68 09/11/2022   LDLCALC 106 (H) 09/11/2022   TRIG 87 09/11/2022   CHOLHDL 2.8 09/11/2022   Last hemoglobin A1c No results found for: "HGBA1C" Last thyroid functions No results found for: "TSH", "T3TOTAL", "T4TOTAL", "THYROIDAB" Last vitamin D Lab Results  Component Value Date   VD25OH 87 09/11/2022   Last vitamin B12 and Folate No results found for: "VITAMINB12", "FOLATE"    The 10-year ASCVD risk score (Arnett DK, et al., 2019) is: 12.5%    Assessment & Plan:   1. Essential hypertension: Blood pressure stable here today, no changes made to medications and appropriate refills sent to  pharmacy.   - amLODipine (NORVASC) 2.5 MG tablet; Take 1 tablet (2.5 mg total) by mouth daily.  Dispense: 90 tablet; Refill: 1  2. Hypercholesteremia: Stable, refill statin.   - rosuvastatin (CRESTOR) 10 MG tablet; Take 1 tablet (10 mg total) by mouth at bedtime.  Dispense: 90 tablet; Refill: 1  3. Nocturia: No symptoms of infection, no overactive bladder symptoms during the day. Reviewed medications, nothing she is on should cause increased urination. Recommend decreasing oral intake before bed and decreasing things that can stimulate the bladder like caffeine and carbonated beverages.    Return in about 6 months (around 09/11/2023).    Margarita Mail, DO

## 2023-03-14 ENCOUNTER — Ambulatory Visit (INDEPENDENT_AMBULATORY_CARE_PROVIDER_SITE_OTHER): Payer: 59 | Admitting: Internal Medicine

## 2023-03-14 ENCOUNTER — Encounter: Payer: Self-pay | Admitting: Internal Medicine

## 2023-03-14 VITALS — BP 128/84 | HR 87 | Temp 98.1°F | Resp 18 | Ht 61.5 in | Wt 160.5 lb

## 2023-03-14 DIAGNOSIS — R351 Nocturia: Secondary | ICD-10-CM | POA: Diagnosis not present

## 2023-03-14 DIAGNOSIS — E7800 Pure hypercholesterolemia, unspecified: Secondary | ICD-10-CM

## 2023-03-14 DIAGNOSIS — I10 Essential (primary) hypertension: Secondary | ICD-10-CM

## 2023-03-14 MED ORDER — ROSUVASTATIN CALCIUM 10 MG PO TABS
10.0000 mg | ORAL_TABLET | Freq: Every day | ORAL | 1 refills | Status: DC
Start: 2023-03-14 — End: 2023-04-18

## 2023-03-14 MED ORDER — AMLODIPINE BESYLATE 2.5 MG PO TABS
2.5000 mg | ORAL_TABLET | Freq: Every day | ORAL | 1 refills | Status: DC
Start: 2023-03-14 — End: 2023-05-19

## 2023-03-31 ENCOUNTER — Encounter: Payer: Self-pay | Admitting: Plastic Surgery

## 2023-03-31 ENCOUNTER — Ambulatory Visit: Payer: 59 | Admitting: Plastic Surgery

## 2023-03-31 VITALS — BP 136/78 | HR 82

## 2023-03-31 DIAGNOSIS — N6459 Other signs and symptoms in breast: Secondary | ICD-10-CM

## 2023-03-31 DIAGNOSIS — Z9889 Other specified postprocedural states: Secondary | ICD-10-CM

## 2023-03-31 NOTE — Progress Notes (Signed)
Ms. Puffenbarger returns today.  She is approximately 5 months status post bilateral breast reductions.  Her breast reduction was complicated by wound healing difficulties especially on the right breast.  On examination today all incisions of healed.  There is still some hypopigmentation along the inferior portion of the right breast but the wound has completely reepithelialized.  On palpation there is a firmness in the right breast consistent with scar tissue from the reduction.  We did discuss the fact though that she should have this evaluated.  She is not scheduled for her mammogram until April however she will be 6 months postop the middle of July and I have asked her to get her mammogram at that time.  I will place the order for the mammogram.  Pathology reviewed, no malignancy in either breast.  Photographs were obtained today with her consent.  She will follow-up with me at the end of January.  I spent 20 minutes reviewing her chart, examining the patient and discussing findings and plan, coordinating care, and documenting.

## 2023-04-18 ENCOUNTER — Encounter: Payer: Self-pay | Admitting: Pediatrics

## 2023-04-18 ENCOUNTER — Ambulatory Visit (INDEPENDENT_AMBULATORY_CARE_PROVIDER_SITE_OTHER): Payer: 59 | Admitting: Pediatrics

## 2023-04-18 VITALS — BP 108/66 | HR 99 | Temp 98.7°F | Resp 16 | Ht 61.85 in | Wt 157.0 lb

## 2023-04-18 DIAGNOSIS — I10 Essential (primary) hypertension: Secondary | ICD-10-CM

## 2023-04-18 DIAGNOSIS — Z1330 Encounter for screening examination for mental health and behavioral disorders, unspecified: Secondary | ICD-10-CM | POA: Diagnosis not present

## 2023-04-18 DIAGNOSIS — Z8639 Personal history of other endocrine, nutritional and metabolic disease: Secondary | ICD-10-CM | POA: Diagnosis not present

## 2023-04-18 DIAGNOSIS — Z7689 Persons encountering health services in other specified circumstances: Secondary | ICD-10-CM

## 2023-04-18 DIAGNOSIS — E7800 Pure hypercholesterolemia, unspecified: Secondary | ICD-10-CM | POA: Diagnosis not present

## 2023-04-18 DIAGNOSIS — Z862 Personal history of diseases of the blood and blood-forming organs and certain disorders involving the immune mechanism: Secondary | ICD-10-CM

## 2023-04-18 MED ORDER — ROSUVASTATIN CALCIUM 10 MG PO TABS: 10.0000 mg | ORAL_TABLET | Freq: Every day | ORAL | 3 refills | Status: DC

## 2023-04-18 NOTE — Progress Notes (Signed)
Establish Care Note  BP 108/66 (BP Location: Left Arm, Patient Position: Sitting, Cuff Size: Normal)   Pulse 99   Temp 98.7 F (37.1 C) (Oral)   Resp 16   Ht 5' 1.85" (1.571 m)   Wt 157 lb (71.2 kg)   SpO2 98%   BMI 28.86 kg/m    Subjective:    Patient ID: Hannah Barnett, female    DOB: [birth year], 72 y.o.   MRN: [id number]  HPI: Hannah Barnett is a 72 y.o. female  Chief Complaint  Patient presents with   Establish Care    Previous PCP at [facility]- Dr. Caralee Ates    Establishing care, the following was discussed today:  Discussed the use of AI scribe software for clinical note transcription with the patient, who gave verbal consent to proceed.  History of Present Illness   The patient, a resident of [place], has a history of hypertension and hyperlipidemia. She reports that her hypertension diagnosis was made approximately two to three years ago during a visit to [facility] when she was upset, leading to an elevated blood pressure reading. Prior to this incident, she had no issues with blood pressure, typically maintaining readings around 118-120 systolic. Since the diagnosis, she has been on amlodipine, initially at a higher dose, but currently at 2.5mg  daily. She expresses uncertainty about the necessity of this medication, given the circumstances of her diagnosis and the low dose she is currently on.  In addition to hypertension, the patient has been managing hyperlipidemia with rosuvastatin, which she reports has been effective in controlling her cholesterol levels. She has not reported any new symptoms or health concerns.  The patient also has a history of elevated B12 levels, which were discovered during a routine check in [place]. At the time, she was taking B12 supplements, which she has since discontinued. She expresses interest in rechecking her B12 levels now that she is no longer taking the supplement. She also mentions a previous normal Vitamin D level and expresses  interest in rechecking this as well.      Current Outpatient Medications on File Prior to Visit  Medication Sig Dispense Refill   amLODipine (NORVASC) 2.5 MG tablet Take 1 tablet (2.5 mg total) by mouth daily. 90 tablet 1   Cholecalciferol 50 MCG (2000 UT) CAPS Take by mouth.     Multiple Vitamin (MULTIVITAMIN WITH MINERALS) TABS tablet Take 1 tablet by mouth daily.     triamcinolone (NASACORT) 55 MCG/ACT AERO nasal inhaler SMARTSIG:Both Nares     No current facility-administered medications on file prior to visit.    #HM Will review HM records and updated as needed.  Relevant past medical, surgical, family and social history reviewed and updated as indicated. Interim medical history since our last visit reviewed. Allergies and medications reviewed and updated.  ROS per HPI unless specifically indicated above     Objective:    BP 108/66 (BP Location: Left Arm, Patient Position: Sitting, Cuff Size: Normal)   Pulse 99   Temp 98.7 F (37.1 C) (Oral)   Resp 16   Ht 5' 1.85" (1.571 m)   Wt 157 lb (71.2 kg)   SpO2 98%   BMI 28.86 kg/m   Wt Readings from Last 3 Encounters:  04/18/23 157 lb (71.2 kg)  03/14/23 160 lb 8 oz (72.8 kg)  02/27/23 162 lb 9.6 oz (73.8 kg)     Physical Exam Constitutional:      Appearance: Normal appearance.  HENT:  Head: Normocephalic and atraumatic.  Eyes:     Pupils: Pupils are equal, round, and reactive to light.  Cardiovascular:     Rate and Rhythm: Normal rate and regular rhythm.     Pulses: Normal pulses.     Heart sounds: Normal heart sounds.  Pulmonary:     Effort: Pulmonary effort is normal.     Breath sounds: Normal breath sounds.  Musculoskeletal:        General: Normal range of motion.     Cervical back: Normal range of motion.  Skin:    General: Skin is warm and dry.     Capillary Refill: Capillary refill takes less than 2 seconds.  Neurological:     General: No focal deficit present.     Mental Status: She is alert.  Mental status is at baseline.  Psychiatric:        Mood and Affect: Mood normal.        Behavior: Behavior normal.         04/18/2023    2:58 PM 03/14/2023   10:03 AM 02/27/2023   10:06 AM 01/03/2023    2:18 PM 12/12/2022   10:48 AM  Depression screen PHQ 2/9  Decreased Interest 0 0 0 0 0  Down, Depressed, Hopeless 0 0 0 0 0  PHQ - 2 Score 0 0 0 0 0  Altered sleeping 2 0 0  0  Tired, decreased energy 0 0 0  0  Change in appetite 0 0 0  0  Feeling bad or failure about yourself  0 0 0  0  Trouble concentrating 0 0 0  0  Moving slowly or fidgety/restless 0 0 0  0  Suicidal thoughts 0 0 0  0  PHQ-9 Score 2 0 0  0  Difficult doing work/chores Not difficult at all Not difficult at all Not difficult at all Not difficult at all Not difficult at all        04/18/2023    2:58 PM  GAD 7 : Generalized Anxiety Score  Nervous, Anxious, on Edge 0  Control/stop worrying 0  Worry too much - different things 0  Trouble relaxing 0  Restless 0  Easily annoyed or irritable 0  Afraid - awful might happen 0  Total GAD 7 Score 0  Anxiety Difficulty Not difficult at all       Assessment & Plan:  Assessment & Plan   Encounter to establish care Reviewed patient record including history, medications, problem list. HM updated. Will follow up w physical in 6 months.  Essential hypertension Assessment & Plan: Patient on low dose Amlodipine 2.5mg  daily, which was initiated due to a single high reading during a stressful event, per patient. Current blood pressure is well controlled. Discussed the possibility of discontinuing Amlodipine given the low dose and the patient's history. Ok to d/c from my perspective, she will think about it and check BP at home if so. - Patient to consider discontinuing Amlodipine and monitor blood pressure at home.  Hypercholesteremia Assessment & Plan: Well controlled on Rosuvastatin. - Continue Rosuvastatin. - Refill prescription for a year's  supply.  Orders: -     Rosuvastatin Calcium; Take 1 tablet (10 mg total) by mouth at bedtime.  Dispense: 90 tablet; Refill: 3  History of anemia due to vitamin B12 deficiency History of vitamin D deficiency Patient previously had slightly elevated B12 levels while on supplements, which have since been discontinued. Vitamin D levels were normal. - Check Vitamin B12  levels today. - Check Vitamin D levels today. -     VITAMIN D 25 Hydroxy (Vit-D Deficiency, Fractures) -     Vitamin B12  Encounter for behavioral health screening As part of their intake evaluation, the patient was screened for depression, anxiety.  PHQ9 SCORE 2, GAD7 SCORE 0. Screening results negative for tested conditions. See plan under problem/diagnosis above.   Follow up plan: Return in about 6 months (around 10/17/2023) for Physical.  Thoams Siefert Howell Pringle, MD

## 2023-04-18 NOTE — Patient Instructions (Addendum)
Ok to stop amlodipine. If you do, be sure to check blood pressures at home. If <130/80 we don't need to restart the medication.  Good to meet you! Welcome to [facility]!  As your primary care doctor, I look forward to working with you to help you reach your health goals.  Please be aware of a couple of logistical items: - If you message me on mychart, it may take me 1-2 business days to get back to you. This is for non-urgent messaging.  - If you require urgent clinical attention, please call the clinic or present to urgent care/emergency room - If you have labs, I typically will send a message about them in 1-2 business days. - I am not here on Mondays, otherwise will be available from Tuesday-Friday during 8a-5pm.

## 2023-04-18 NOTE — Assessment & Plan Note (Signed)
Patient on low dose Amlodipine 2.5mg  daily, which was initiated due to a single high reading during a stressful event, per patient. Current blood pressure is well controlled. Discussed the possibility of discontinuing Amlodipine given the low dose and the patient's history. - Patient to consider discontinuing Amlodipine and monitor blood pressure at home.

## 2023-04-18 NOTE — Assessment & Plan Note (Signed)
Well controlled on Rosuvastatin. - Continue Rosuvastatin. - Refill prescription for a year's supply.

## 2023-04-19 LAB — VITAMIN D 25 HYDROXY (VIT D DEFICIENCY, FRACTURES): Vit D, 25-Hydroxy: 52.5 ng/mL (ref 30.0–100.0)

## 2023-04-19 LAB — VITAMIN B12: Vitamin B-12: 958 pg/mL (ref 232–1245)

## 2023-04-24 NOTE — Telephone Encounter (Signed)
 Called and scheduled appointment on 05/06/2023 @ 11:20 am.

## 2023-04-29 ENCOUNTER — Other Ambulatory Visit: Payer: Self-pay | Admitting: Plastic Surgery

## 2023-04-29 DIAGNOSIS — N644 Mastodynia: Secondary | ICD-10-CM

## 2023-04-29 DIAGNOSIS — Z9889 Other specified postprocedural states: Secondary | ICD-10-CM

## 2023-05-06 ENCOUNTER — Encounter: Payer: Self-pay | Admitting: Pediatrics

## 2023-05-06 ENCOUNTER — Ambulatory Visit (INDEPENDENT_AMBULATORY_CARE_PROVIDER_SITE_OTHER): Payer: 59 | Admitting: Pediatrics

## 2023-05-06 VITALS — BP 124/83 | HR 73 | Temp 97.8°F | Resp 15 | Wt 159.8 lb

## 2023-05-06 DIAGNOSIS — Z1330 Encounter for screening examination for mental health and behavioral disorders, unspecified: Secondary | ICD-10-CM | POA: Diagnosis not present

## 2023-05-06 DIAGNOSIS — R7989 Other specified abnormal findings of blood chemistry: Secondary | ICD-10-CM | POA: Diagnosis not present

## 2023-05-06 DIAGNOSIS — G4700 Insomnia, unspecified: Secondary | ICD-10-CM | POA: Insufficient documentation

## 2023-05-06 DIAGNOSIS — I10 Essential (primary) hypertension: Secondary | ICD-10-CM

## 2023-05-06 DIAGNOSIS — Z131 Encounter for screening for diabetes mellitus: Secondary | ICD-10-CM

## 2023-05-06 LAB — CBC
Hematocrit: 36.6 % (ref 34.0–46.6)
Hemoglobin: 12.8 g/dL (ref 11.1–15.9)
MCH: 27.2 pg (ref 26.6–33.0)
MCHC: 35 g/dL (ref 31.5–35.7)
MCV: 78 fL — ABNORMAL LOW (ref 79–97)
Platelets: 390 10*3/uL (ref 150–450)
RBC: 4.7 x10E6/uL (ref 3.77–5.28)
RDW: 16.1 % — ABNORMAL HIGH (ref 11.7–15.4)
WBC: 5.5 10*3/uL (ref 3.4–10.8)

## 2023-05-06 NOTE — Progress Notes (Signed)
 Office Visit  BP 124/83 (BP Location: Left Arm, Patient Position: Sitting, Cuff Size: Normal)   Pulse 73   Temp 97.8 F (36.6 C) (Oral)   Resp 15   Wt 159 lb 12.8 oz (72.5 kg)   SpO2 98%   BMI 29.37 kg/m    Subjective:    Patient ID: Hannah Barnett, female    DOB: May 26, 1950, 73 y.o.   MRN: [id number]  HPI: Hannah Barnett is a 73 y.o. female  Chief Complaint  Patient presents with   Labs Only    Would like A1c checked Platelets were high prior to breast reduction    Insomnia    Ongoing for years    Discussed the use of AI scribe software for clinical note transcription with the patient, who gave verbal consent to proceed.  History of Present Illness   The patient presents with concerns about sleep disturbances, specifically difficulty maintaining sleep. She reports waking up between 4 and 5 AM to use the bathroom, but is unsure if this is the primary cause of her sleep disruption. She denies experiencing nightmares. The patient has been considering using a patch containing melatonin, magnesium, hops, and valerian root, but has not yet tried it. She reports not currently using any other sleep aids and expresses dissatisfaction with melatonin.  In addition to sleep concerns, the patient mentions a need for blood sugar and platelet checks, but does not elaborate on any symptoms related to these conditions. She also discusses her blood pressure, which has been noted to be low. The patient has a home blood pressure cuff and has been monitoring her readings. She reports a recent reading of 132/78.  The patient also mentions a scheduled follow-up with a provider who previously performed a procedure for her, but does not provide details about the nature of the procedure or any related symptoms. She also discusses a family member with a history of lung cancer who is currently in remission, and expresses a desire for this family member to receive care at the same facility.      Relevant  past medical, surgical, family and social history reviewed and updated as indicated. Interim medical history since our last visit reviewed. Allergies and medications reviewed and updated.  ROS per HPI unless specifically indicated above     Objective:    BP 124/83 (BP Location: Left Arm, Patient Position: Sitting, Cuff Size: Normal)   Pulse 73   Temp 97.8 F (36.6 C) (Oral)   Resp 15   Wt 159 lb 12.8 oz (72.5 kg)   SpO2 98%   BMI 29.37 kg/m   Wt Readings from Last 3 Encounters:  05/06/23 159 lb 12.8 oz (72.5 kg)  04/18/23 157 lb (71.2 kg)  03/14/23 160 lb 8 oz (72.8 kg)     Physical Exam Constitutional:      Appearance: Normal appearance.  Cardiovascular:     Rate and Rhythm: Normal rate and regular rhythm.     Pulses: Normal pulses.     Heart sounds: Normal heart sounds.  Pulmonary:     Effort: Pulmonary effort is normal.  Musculoskeletal:        General: Normal range of motion.  Skin:    Comments: Normal skin color  Neurological:     General: No focal deficit present.     Mental Status: She is alert. Mental status is at baseline.  Psychiatric:        Mood and Affect: Mood normal.  Behavior: Behavior normal.        Thought Content: Thought content normal.         05/06/2023   11:28 AM 04/18/2023    2:58 PM 03/14/2023   10:03 AM 02/27/2023   10:06 AM 01/03/2023    2:18 PM  Depression screen PHQ 2/9  Decreased Interest 0 0 0 0 0  Down, Depressed, Hopeless 0 0 0 0 0  PHQ - 2 Score 0 0 0 0 0  Altered sleeping 1 2 0 0   Tired, decreased energy 0 0 0 0   Change in appetite 0 0 0 0   Feeling bad or failure about yourself  0 0 0 0   Trouble concentrating 0 0 0 0   Moving slowly or fidgety/restless 0 0 0 0   Suicidal thoughts 0 0 0 0   PHQ-9 Score 1 2 0 0   Difficult doing work/chores Not difficult at all Not difficult at all Not difficult at all Not difficult at all Not difficult at all       05/06/2023   11:29 AM 04/18/2023    2:58 PM  GAD 7 :  Generalized Anxiety Score  Nervous, Anxious, on Edge 0 0  Control/stop worrying 0 0  Worry too much - different things 0 0  Trouble relaxing 0 0  Restless 0 0  Easily annoyed or irritable 0 0  Afraid - awful might happen 0 0  Total GAD 7 Score 0 0  Anxiety Difficulty Not difficult at all Not difficult at all       Assessment & Plan:  Assessment & Plan   Essential hypertension Assessment & Plan: Blood pressure readings consistently on the lower end, even with small dose of medication. Discussed the risks of hypotension. -Discontinue current blood pressure medication. -Monitor blood pressure at home. -Bring home blood pressure cuff to next appointment for comparison with office readings.   Insomnia, unspecified type Assessment & Plan: Difficulty staying asleep, waking up between 4-5 AM to urinate. No other sleep aids currently in use. Patient has purchased a natural sleep aid patch containing melatonin, magnesium, hops, and valerian root, but has not yet tried it. -Try the sleep aid patch nightly for two weeks. -Ensure good sleep hygiene (avoiding phone before bed, maintaining consistent sleep schedule). -Consider alternative sleep aids such as trazodone  if the patch is ineffective.   High platelet count Noted on previous blood work. Would like repeat today. -     CBC  Diabetes mellitus screening Discussed checking A1c to assess blood sugar control and to rule out diabetes as a cause of nocturia. -     Hemoglobin A1c  Encounter for behavioral health screening As part of their intake evaluation, the patient was screened for depression, anxiety.  PHQ9 SCORE 1, GAD7 SCORE 0. Screening results negative for tested conditions. Continue to monitor.   Follow up plan: Return in about 17 days (around 05/23/2023) for HTN, insomnia.  Destynee Stringfellow Hannah NETT, MD

## 2023-05-06 NOTE — Assessment & Plan Note (Signed)
 Blood pressure readings consistently on the lower end, even with small dose of medication. Discussed the risks of hypotension. -Discontinue current blood pressure medication. -Monitor blood pressure at home. -Bring home blood pressure cuff to next appointment for comparison with office readings.

## 2023-05-06 NOTE — Patient Instructions (Addendum)
 Stop amlodipine 2.5mg , keep checking blood pressure. Stay hydrated. Goal is 130/80 or under.  Please bring blood pressure cuff next visit

## 2023-05-06 NOTE — Assessment & Plan Note (Signed)
 Difficulty staying asleep, waking up between 4-5 AM to urinate. No other sleep aids currently in use. Patient has purchased a natural sleep aid patch containing melatonin, magnesium, hops, and valerian root, but has not yet tried it. -Try the sleep aid patch nightly for two weeks. -Ensure good sleep hygiene (avoiding phone before bed, maintaining consistent sleep schedule). -Consider alternative sleep aids such as trazodone  if the patch is ineffective.

## 2023-05-07 LAB — HEMOGLOBIN A1C
Est. average glucose Bld gHb Est-mCnc: 111 mg/dL
Hgb A1c MFr Bld: 5.5 % (ref 4.8–5.6)

## 2023-05-08 ENCOUNTER — Other Ambulatory Visit: Payer: 59

## 2023-05-13 ENCOUNTER — Telehealth: Payer: Self-pay

## 2023-05-13 NOTE — Telephone Encounter (Signed)
Received request for signature for additional orders from [facility]. Dr. Ladona Ridgel signed via Epic.

## 2023-05-15 ENCOUNTER — Ambulatory Visit
Admission: RE | Admit: 2023-05-15 | Discharge: 2023-05-15 | Disposition: A | Discharge status: Home / Self Care | Payer: 59 | Source: Ambulatory Visit | Attending: Plastic Surgery | Admitting: Plastic Surgery

## 2023-05-15 DIAGNOSIS — N644 Mastodynia: Secondary | ICD-10-CM

## 2023-05-15 DIAGNOSIS — Z9889 Other specified postprocedural states: Secondary | ICD-10-CM

## 2023-05-16 ENCOUNTER — Other Ambulatory Visit: Payer: Self-pay | Admitting: Plastic Surgery

## 2023-05-16 DIAGNOSIS — N641 Fat necrosis of breast: Secondary | ICD-10-CM

## 2023-05-19 ENCOUNTER — Encounter: Payer: Self-pay | Admitting: Plastic Surgery

## 2023-05-19 ENCOUNTER — Ambulatory Visit (INDEPENDENT_AMBULATORY_CARE_PROVIDER_SITE_OTHER): Payer: 59 | Admitting: Plastic Surgery

## 2023-05-19 VITALS — BP 136/72 | HR 78

## 2023-05-19 DIAGNOSIS — Z9889 Other specified postprocedural states: Secondary | ICD-10-CM

## 2023-05-19 DIAGNOSIS — L905 Scar conditions and fibrosis of skin: Secondary | ICD-10-CM | POA: Diagnosis not present

## 2023-05-19 NOTE — Progress Notes (Signed)
Ms. Pha returns today now 6 months postop from bilateral breast reduction complicated by skin loss on the inferior portion of both breast.  The areas of skin loss have now healed and reepithelialized though there is some hypopigmentation.  On physical exam she had a fullness in the right breast in the upper outer quadrant which was most likely consistent with postoperative changes however I did feel that imaging was warranted.  She underwent mammogram which was BI-RADS 3 consistent with fat necrosis and scar.  She is scheduled to keep her April mammogram for reevaluation.  Overall doing well.  I suspect the hypopigmentation will continue to improve with time though she may have some permanently.  Will have her follow-up in July unless there are issues with the mammogram.

## 2023-05-23 ENCOUNTER — Encounter: Payer: Self-pay | Admitting: Pediatrics

## 2023-05-23 ENCOUNTER — Ambulatory Visit (INDEPENDENT_AMBULATORY_CARE_PROVIDER_SITE_OTHER): Payer: 59 | Admitting: Pediatrics

## 2023-05-23 VITALS — BP 129/72 | HR 79 | Temp 98.3°F | Ht 63.0 in | Wt 158.8 lb

## 2023-05-23 DIAGNOSIS — Z23 Encounter for immunization: Secondary | ICD-10-CM

## 2023-05-23 DIAGNOSIS — G4700 Insomnia, unspecified: Secondary | ICD-10-CM | POA: Diagnosis not present

## 2023-05-23 DIAGNOSIS — I10 Essential (primary) hypertension: Secondary | ICD-10-CM | POA: Diagnosis not present

## 2023-05-23 DIAGNOSIS — Z1330 Encounter for screening examination for mental health and behavioral disorders, unspecified: Secondary | ICD-10-CM

## 2023-05-23 MED ORDER — TRAZODONE HCL 50 MG PO TABS: 25.0000 mg | ORAL_TABLET | Freq: Every evening | ORAL | 3 refills | Status: DC | PRN

## 2023-05-23 NOTE — Assessment & Plan Note (Signed)
OTC patch w melatonin did not work, she will consider trazodone. Sent rx in case she decides to start.

## 2023-05-23 NOTE — Patient Instructions (Addendum)
   We will see each other for your physical in the summer!  Measure your blood pressure at home! Home Blood Pressure Monitoring is an important part of managing blood pressure and thought to be more accurate than the measures we get in the clinic.  Here's some tips on how to take your blood pressure accurately at home and some highly rated monitors. Most insurances (except for Medicaid) won't pay for monitors, so unfortunately they are an out-of-pocket expense for most people.  Taking an accurate blood pressure measurement: To get an accurate blood pressure reading, empty your bladder first, then rest in a seated position for at least 5 minutes. Ideally, no caffeine or tobacco use in last 30 minutes. Use an arm cuff (not wrist - see recommendations below) seated in a chair with a back next to a table or object that is high enough that you can rest your arm so the blood pressure cuff is at the level of your heart and you can lean back comfortably. Keep both feet on the floor and don't talk while the machine is working. Checking at different times of the day can be helpful to get an idea of your average numbers. Your goal blood pressure should be <140/90.  For more information (and the source of the below info-graphic on how to get set up to get an accurate reading) - check out: [employer]

## 2023-05-23 NOTE — Assessment & Plan Note (Signed)
Blood pressure well controlled in the office. Home blood pressure readings may be inaccurate due to potential calibration issues with home device (off by ). Continue to monitor at home. No meds at this time. -Recommended purchasing a new blood pressure monitor or contacting the manufacturer for a replacement.

## 2023-05-23 NOTE — Progress Notes (Signed)
Office Visit  BP 129/72 (BP Location: Right Arm, Patient Position: Sitting, Cuff Size: Normal)   Pulse 79   Temp 98.3 F (36.8 C) (Oral)   Ht 5\' 3"  (1.6 m)   Wt 158 lb 12.8 oz (72 kg)   SpO2 100%   BMI 28.13 kg/m    Subjective:    Patient ID: Hannah Barnett, female    DOB: 08/25/1950, 73 y.o.   MRN: [id number]  HPI: Hannah Barnett is a 74 y.o. female  Chief Complaint  Patient presents with   Follow-up    Discussed the use of AI scribe software for clinical note transcription with the patient, who gave verbal consent to proceed.  History of Present Illness   The patient presents with concerns about sleep and blood pressure monitoring.  The patient has ongoing issues with sleep, describing it as bothersome. She discontinued the use of sleep patches due to ineffectiveness. She experiences some sleep during the day when sitting, but not much at night, which she finds insufficient.  There is a discrepancy between her home blood pressure monitor and the clinic's readings. Her home monitor showed a reading of 151, which she found concerning, while the clinic's reading was 113/76. She acknowledges a potential 10-point discrepancy in her home monitor readings and expresses interest in obtaining a new blood pressure monitor.  She lives in [place] and can walk to a family member's house. No headaches, chest pain, or shortness of breath.     Relevant past medical, surgical, family and social history reviewed and updated as indicated. Interim medical history since our last visit reviewed. Allergies and medications reviewed and updated.  ROS per HPI unless specifically indicated above     Objective:    BP 129/72 (BP Location: Right Arm, Patient Position: Sitting, Cuff Size: Normal)   Pulse 79   Temp 98.3 F (36.8 C) (Oral)   Ht 5\' 3"  (1.6 m)   Wt 158 lb 12.8 oz (72 kg)   SpO2 100%   BMI 28.13 kg/m   Wt Readings from Last 3 Encounters:  05/23/23 158 lb 12.8 oz (72 kg)   05/06/23 159 lb 12.8 oz (72.5 kg)  04/18/23 157 lb (71.2 kg)     Physical Exam Constitutional:      Appearance: Normal appearance.  HENT:     Head: Normocephalic and atraumatic.  Eyes:     Pupils: Pupils are equal, round, and reactive to light.  Cardiovascular:     Rate and Rhythm: Normal rate and regular rhythm.     Pulses: Normal pulses.     Heart sounds: Normal heart sounds.  Pulmonary:     Effort: Pulmonary effort is normal.     Breath sounds: Normal breath sounds.  Musculoskeletal:        General: Normal range of motion.     Cervical back: Normal range of motion.  Skin:    General: Skin is warm and dry.     Capillary Refill: Capillary refill takes less than 2 seconds.  Neurological:     General: No focal deficit present.     Mental Status: She is alert. Mental status is at baseline.  Psychiatric:        Mood and Affect: Mood normal.        Behavior: Behavior normal.         05/06/2023   11:28 AM 04/18/2023    2:58 PM 03/14/2023   10:03 AM 02/27/2023   10:06 AM 01/03/2023  2:18 PM  Depression screen PHQ 2/9  Decreased Interest 0 0 0 0 0  Down, Depressed, Hopeless 0 0 0 0 0  PHQ - 2 Score 0 0 0 0 0  Altered sleeping 1 2 0 0   Tired, decreased energy 0 0 0 0   Change in appetite 0 0 0 0   Feeling bad or failure about yourself  0 0 0 0   Trouble concentrating 0 0 0 0   Moving slowly or fidgety/restless 0 0 0 0   Suicidal thoughts 0 0 0 0   PHQ-9 Score 1 2 0 0   Difficult doing work/chores Not difficult at all Not difficult at all Not difficult at all Not difficult at all Not difficult at all       05/06/2023   11:29 AM 04/18/2023    2:58 PM  GAD 7 : Generalized Anxiety Score  Nervous, Anxious, on Edge 0 0  Control/stop worrying 0 0  Worry too much - different things 0 0  Trouble relaxing 0 0  Restless 0 0  Easily annoyed or irritable 0 0  Afraid - awful might happen 0 0  Total GAD 7 Score 0 0  Anxiety Difficulty Not difficult at all Not difficult  at all       Assessment & Plan:  Assessment & Plan   Essential hypertension Assessment & Plan: Blood pressure well controlled in the office. Home blood pressure readings may be inaccurate due to potential calibration issues with home device (off by ). Continue to monitor at home. No meds at this time. -Recommended purchasing a new blood pressure monitor or contacting the manufacturer for a replacement.     Insomnia, unspecified type Assessment & Plan: OTC patch w melatonin did not work, she will consider trazodone. Sent rx in case she decides to start.  Orders: -     traZODone HCl; Take 0.5-1 tablets (25-50 mg total) by mouth at bedtime as needed for sleep.  Dispense: 30 tablet; Refill: 3  Encounter for behavioral health screening As part of their intake evaluation, the patient was screened for depression, anxiety.  PHQ9 SCORE 1, GAD7 SCORE 0. Screening results negative for tested conditions. CTM.  Immunization due -     Pfizer Comirnaty Covid-19 Vaccine 31yrs & older  Follow up plan: Return in about 6 months (around 11/20/2023) for Physical.  Raelyn Racette Howell Pringle, MD

## 2023-05-29 ENCOUNTER — Telehealth: Payer: Self-pay | Admitting: Pediatrics

## 2023-05-29 NOTE — Telephone Encounter (Signed)
 Patient brought in BP reading for provider to review. I am placing in providers folder.

## 2023-06-17 ENCOUNTER — Ambulatory Visit (INDEPENDENT_AMBULATORY_CARE_PROVIDER_SITE_OTHER): Payer: 59 | Admitting: Nurse Practitioner

## 2023-06-17 ENCOUNTER — Encounter: Payer: Self-pay | Admitting: Nurse Practitioner

## 2023-06-17 VITALS — BP 124/84 | HR 72 | Ht 63.0 in | Wt 154.0 lb

## 2023-06-17 DIAGNOSIS — I10 Essential (primary) hypertension: Secondary | ICD-10-CM

## 2023-06-17 NOTE — Progress Notes (Signed)
 BP 124/84 (BP Location: Left Arm, Patient Position: Sitting, Cuff Size: Normal)   Pulse 72   Ht 5\' 3"  (1.6 m)   Wt 154 lb (69.9 kg)   SpO2 97%   BMI 27.28 kg/m    Subjective:    Patient ID: Hannah Barnett, female    DOB: April 09, 1951, 73 y.o.   MRN: [id number]  HPI: Hannah Barnett is a 73 y.o. female  Chief Complaint  Patient presents with   Follow-up   HYPERTENSION without Chronic Kidney Disease Patient states her blood pressure is 120/170-80.  When her blood pressure is elevated she deep breaths and brings it down to 120/80s Hypertension status: controlled  Satisfied with current treatment? yes Duration of hypertension: years BP monitoring frequency:  daily BP range:  BP medication side effects:  no Medication compliance: excellent compliance Previous BP meds:amlodipine Aspirin: no Recurrent headaches: no Visual changes: no Palpitations: no Dyspnea: no Chest pain: no Lower extremity edema: no Dizzy/lightheaded: no  Relevant past medical, surgical, family and social history reviewed and updated as indicated. Interim medical history since our last visit reviewed. Allergies and medications reviewed and updated.  Review of Systems  Eyes:  Negative for visual disturbance.  Respiratory:  Negative for cough, chest tightness and shortness of breath.   Cardiovascular:  Negative for chest pain, palpitations and leg swelling.  Neurological:  Negative for dizziness and headaches.    Per HPI unless specifically indicated above     Objective:    BP 124/84 (BP Location: Left Arm, Patient Position: Sitting, Cuff Size: Normal)   Pulse 72   Ht 5\' 3"  (1.6 m)   Wt 154 lb (69.9 kg)   SpO2 97%   BMI 27.28 kg/m   Wt Readings from Last 3 Encounters:  06/17/23 154 lb (69.9 kg)  05/23/23 158 lb 12.8 oz (72 kg)  05/06/23 159 lb 12.8 oz (72.5 kg)    Physical Exam Vitals and nursing note reviewed.  Constitutional:      General: She is not in acute distress.    Appearance:  Normal appearance. She is normal weight. She is not ill-appearing, toxic-appearing or diaphoretic.  HENT:     Head: Normocephalic.     Right Ear: External ear normal.     Left Ear: External ear normal.     Nose: Nose normal.     Mouth/Throat:     Mouth: Mucous membranes are moist.     Pharynx: Oropharynx is clear.  Eyes:     General:        Right eye: No discharge.        Left eye: No discharge.     Extraocular Movements: Extraocular movements intact.     Conjunctiva/sclera: Conjunctivae normal.     Pupils: Pupils are equal, round, and reactive to light.  Cardiovascular:     Rate and Rhythm: Normal rate and regular rhythm.     Heart sounds: No murmur heard. Pulmonary:     Effort: Pulmonary effort is normal. No respiratory distress.     Breath sounds: Normal breath sounds. No wheezing or rales.  Musculoskeletal:     Cervical back: Normal range of motion and neck supple.  Skin:    General: Skin is warm and dry.     Capillary Refill: Capillary refill takes less than 2 seconds.  Neurological:     General: No focal deficit present.     Mental Status: She is alert and oriented to person, place, and time. Mental status is at  baseline.  Psychiatric:        Mood and Affect: Mood normal.        Behavior: Behavior normal.        Thought Content: Thought content normal.        Judgment: Judgment normal.     Results for orders placed or performed in visit on 05/06/23  HgB A1c   Collection Time: 05/06/23 11:54 AM  Result Value Ref Range   Hgb A1c MFr Bld 5.5 4.8 - 5.6 %   Est. average glucose Bld gHb Est-mCnc 111 mg/dL  CBC   Collection Time: 05/06/23 11:54 AM  Result Value Ref Range   WBC 5.5 3.4 - 10.8 x10E3/uL   RBC 4.70 3.77 - 5.28 x10E6/uL   Hemoglobin 12.8 11.1 - 15.9 g/dL   Hematocrit 16.1 09.6 - 46.6 %   MCV 78 (L) 79 - 97 fL   MCH 27.2 26.6 - 33.0 pg   MCHC 35.0 31.5 - 35.7 g/dL   RDW 04.5 (H) 40.9 - 81.1 %   Platelets 390 150 - 450 x10E3/uL      Assessment &  Plan:   Problem List Items Addressed This Visit   None    Follow up plan: No follow-ups on file.

## 2023-06-17 NOTE — Assessment & Plan Note (Signed)
 Chronic. Suspect patient is anxious when her blood pressure is elevated.  Blood pressure typically was in 120-130/80 with some elevated pressures throughout her log.  Do not recommend she restart medication. Discussed checking blood pressures 3x weekly.  Follow up in 3 months.  Call sooner if concerns arise.

## 2023-06-19 ENCOUNTER — Ambulatory Visit: Payer: 59 | Admitting: Pediatrics

## 2023-08-11 ENCOUNTER — Ambulatory Visit
Admission: RE | Admit: 2023-08-11 | Discharge: 2023-08-11 | Disposition: A | Discharge status: Home / Self Care | Payer: 59 | Source: Ambulatory Visit | Attending: Plastic Surgery | Admitting: Plastic Surgery

## 2023-08-11 DIAGNOSIS — N641 Fat necrosis of breast: Secondary | ICD-10-CM

## 2023-08-13 ENCOUNTER — Other Ambulatory Visit: Payer: Self-pay | Admitting: Plastic Surgery

## 2023-08-13 DIAGNOSIS — N6310 Unspecified lump in the right breast, unspecified quadrant: Secondary | ICD-10-CM

## 2023-09-11 ENCOUNTER — Ambulatory Visit: Payer: 59 | Admitting: Internal Medicine

## 2023-09-11 ENCOUNTER — Encounter: Payer: Self-pay | Admitting: Pediatrics

## 2023-09-11 ENCOUNTER — Ambulatory Visit (INDEPENDENT_AMBULATORY_CARE_PROVIDER_SITE_OTHER): Payer: 59 | Admitting: Pediatrics

## 2023-09-11 VITALS — BP 109/66 | HR 89 | Temp 98.1°F | Ht 63.0 in | Wt 156.2 lb

## 2023-09-11 DIAGNOSIS — I10 Essential (primary) hypertension: Secondary | ICD-10-CM | POA: Diagnosis not present

## 2023-09-11 DIAGNOSIS — G4700 Insomnia, unspecified: Secondary | ICD-10-CM

## 2023-09-11 DIAGNOSIS — E7800 Pure hypercholesterolemia, unspecified: Secondary | ICD-10-CM | POA: Diagnosis not present

## 2023-09-11 NOTE — Patient Instructions (Addendum)
 Keep checking blood pressure medications  Call the company for a new holter

## 2023-09-11 NOTE — Progress Notes (Unsigned)
   Office Visit  BP 109/66   Pulse 89   Temp 98.1 F (36.7 C) (Oral)   Ht 5\' 3"  (1.6 m)   Wt 156 lb 3.2 oz (70.9 kg)   SpO2 98%   BMI 27.67 kg/m    Subjective:    Patient ID: Latina Pol, female    DOB: 1951/02/08, 73 y.o.   MRN: [id number]  HPI: JANNELLY BERGREN is a 73 y.o. female  Chief Complaint  Patient presents with   Hypertension    Discussed the use of AI scribe software for clinical note transcription with the patient, who gave verbal consent to proceed.  History of Present Illness     Relevant past medical, surgical, family and social history reviewed and updated as indicated. Interim medical history since our last visit reviewed. Allergies and medications reviewed and updated.  ROS per HPI unless specifically indicated above     Objective:     BP 109/66   Pulse 89   Temp 98.1 F (36.7 C) (Oral)   Ht 5\' 3"  (1.6 m)   Wt 156 lb 3.2 oz (70.9 kg)   SpO2 98%   BMI 27.67 kg/m   Wt Readings from Last 3 Encounters:  09/11/23 156 lb 3.2 oz (70.9 kg)  06/17/23 154 lb (69.9 kg)  05/23/23 158 lb 12.8 oz (72 kg)     Physical Exam      09/11/2023   11:05 AM 06/17/2023   10:30 AM 05/06/2023   11:28 AM 04/18/2023    2:58 PM 03/14/2023   10:03 AM  Depression screen PHQ 2/9  Decreased Interest 0 0 0 0 0  Down, Depressed, Hopeless 0 0 0 0 0  PHQ - 2 Score 0 0 0 0 0  Altered sleeping 2 0 1 2 0  Tired, decreased energy 0 0 0 0 0  Change in appetite 0 0 0 0 0  Feeling bad or failure about yourself  0 0 0 0 0  Trouble concentrating 0 0 0 0 0  Moving slowly or fidgety/restless 0 0 0 0 0  Suicidal thoughts 0 0 0 0 0  PHQ-9 Score 2 0 1 2 0  Difficult doing work/chores Not difficult at all Not difficult at all Not difficult at all Not difficult at all Not difficult at all       09/11/2023   11:06 AM 06/17/2023   10:30 AM 05/06/2023   11:29 AM 04/18/2023    2:58 PM  GAD 7 : Generalized Anxiety Score  Nervous, Anxious, on Edge 0 0 0 0  Control/stop  worrying 0 0 0 0  Worry too much - different things 0 0 0 0  Trouble relaxing 0 0 0 0  Restless 0 0 0 0  Easily annoyed or irritable 0 0 0 0  Afraid - awful might happen 0 0 0 0  Total GAD 7 Score 0 0 0 0  Anxiety Difficulty Not difficult at all Not difficult at all Not difficult at all Not difficult at all       Assessment & Plan:  Assessment & Plan   There are no diagnoses linked to this encounter.   Assessment and Plan Assessment & Plan      Follow up plan: No follow-ups on file.  Hadassah Letters, MD

## 2023-09-16 ENCOUNTER — Encounter: Payer: Self-pay | Admitting: Pediatrics

## 2023-09-16 NOTE — Assessment & Plan Note (Signed)
 On crestor . She may change to atorvastatin and will message me her preference.

## 2023-09-16 NOTE — Assessment & Plan Note (Signed)
 Chronic issue. Encouraged to increase melatonin dose. She is hesitant to start trazodone  but has it at home. Continue to monitor.

## 2023-09-16 NOTE — Assessment & Plan Note (Signed)
 She is well controlled here. We checked home machine and there is about 20 point difference. I encouraged pt to request a new one from manufacturer. Can continue to check with new machine.

## 2023-10-21 ENCOUNTER — Ambulatory Visit (INDEPENDENT_AMBULATORY_CARE_PROVIDER_SITE_OTHER): Payer: Self-pay | Admitting: Pediatrics

## 2023-10-21 ENCOUNTER — Encounter: Payer: Self-pay | Admitting: Pediatrics

## 2023-10-21 VITALS — BP 120/74 | HR 78 | Temp 97.9°F | Ht 63.0 in | Wt 160.4 lb

## 2023-10-21 DIAGNOSIS — Z1330 Encounter for screening examination for mental health and behavioral disorders, unspecified: Secondary | ICD-10-CM

## 2023-10-21 DIAGNOSIS — Z0000 Encounter for general adult medical examination without abnormal findings: Secondary | ICD-10-CM

## 2023-10-21 LAB — CBC
Hematocrit: 37.3 % (ref 34.0–46.6)
Hemoglobin: 13.6 g/dL (ref 11.1–15.9)
MCH: 28.9 pg (ref 26.6–33.0)
MCHC: 36.5 g/dL — ABNORMAL HIGH (ref 31.5–35.7)
MCV: 79 fL (ref 79–97)
Platelets: 399 10*3/uL (ref 150–450)
RBC: 4.71 x10E6/uL (ref 3.77–5.28)
RDW: 15.7 % — ABNORMAL HIGH (ref 11.7–15.4)
WBC: 5.7 10*3/uL (ref 3.4–10.8)

## 2023-10-21 NOTE — Progress Notes (Signed)
 BP 120/74   Pulse 78   Temp 97.9 F (36.6 C) (Oral)   Ht 5' 3 (1.6 m)   Wt 160 lb 6.4 oz (72.8 kg)   SpO2 98%   BMI 28.41 kg/m    Annual Physical Exam - Female  Subjective:   CC: Annual Exam   Hannah Barnett is a 73 y.o. female patient here for a preventative health maintenance exam and has no acute complaints.  Health Habits: DIET: in general, a healthy diet   EXERCISE: she does shoulder exercises multiple times/week on average  DENTAL EXAM: Due - scheduled in July EYE EXAM: Up to Date                       Relevant Gynecologic History LMP: No LMP recorded. Patient is postmenopausal.  Menstrual Status: postmenopausal, Flow post-menopausal PAP History:  No Cervical Cancer Screening results to display.  History abnormal PAP: No  Sexual activity: n/a Family history breast, ovarian cancer: No Domestic Violence Screen, feels safe at home: Yes  family history includes Dementia in her mother; Lung cancer in her father.  Social History   Tobacco Use   Smoking status: Former    Types: Cigarettes   Smokeless tobacco: Never  Vaping Use   Vaping status: Never Used  Substance Use Topics   Alcohol use: Not Currently   Drug use: Never   Social History   Social History Narrative   Pt will be using transportation service for transport to [employer] in [place], Retired    Social drivers questionnaire is reviewed and is positive for : none  Depression Screening:     10/21/2023   10:56 AM 09/11/2023   11:05 AM 06/17/2023   10:30 AM 05/06/2023   11:28 AM 04/18/2023    2:58 PM  Depression screen PHQ 2/9  Decreased Interest 0 0 0 0 0  Down, Depressed, Hopeless 0 0 0 0 0  PHQ - 2 Score 0 0 0 0 0  Altered sleeping 0 2 0 1 2  Tired, decreased energy 0 0 0 0 0  Change in appetite 0 0 0 0 0  Feeling bad or failure about yourself  0 0 0 0 0  Trouble concentrating 0 0 0 0 0  Moving slowly or fidgety/restless 0 0 0 0 0  Suicidal thoughts 0 0 0 0 0  PHQ-9  Score 0 2 0 1 2  Difficult doing work/chores Not difficult at all Not difficult at all Not difficult at all Not difficult at all Not difficult at all       10/21/2023   10:56 AM 09/11/2023   11:06 AM 06/17/2023   10:30 AM 05/06/2023   11:29 AM  GAD 7 : Generalized Anxiety Score  Nervous, Anxious, on Edge 0 0 0 0  Control/stop worrying 0 0 0 0  Worry too much - different things 0 0 0 0  Trouble relaxing 0 0 0 0  Restless 0 0 0 0  Easily annoyed or irritable 0 0 0 0  Afraid - awful might happen 0 0 0 0  Total GAD 7 Score 0 0 0 0  Anxiety Difficulty Not difficult at all Not difficult at all Not difficult at all Not difficult at all    Mental Health Plan: CTM  Self Management Goals  Goals   None     Health Maintenance Colon Cancer Screening : Up to Date Mammogram : Up to Date DXA  scan : Up to Date Immunizations : due for pna and shingles, will think about it  Review of Systems See HPI for relevant ROS.  Outpatient Medications Prior to Visit  Medication Sig Dispense Refill   Cholecalciferol 50 MCG (2000 UT) CAPS Take by mouth.     Multiple Vitamin (MULTIVITAMIN WITH MINERALS) TABS tablet Take 1 tablet by mouth daily.     rosuvastatin  (CRESTOR ) 10 MG tablet Take 1 tablet (10 mg total) by mouth at bedtime. 90 tablet 3   traZODone  (DESYREL ) 50 MG tablet Take 0.5-1 tablets (25-50 mg total) by mouth at bedtime as needed for sleep. 30 tablet 3   triamcinolone  (NASACORT ) 55 MCG/ACT AERO nasal inhaler SMARTSIG:Both Nares     No facility-administered medications prior to visit.     Patient Active Problem List   Diagnosis Date Noted   Insomnia 05/06/2023   Essential hypertension 12/03/2019   Chronic right shoulder pain 10/10/2015   History of tobacco use 04/10/2013   Hypercholesteremia 02/29/2012   Allergic rhinitis 12/18/2009    Objective:   Vitals:   10/21/23 1045  BP: 120/74  Pulse: 78  Temp: 97.9 F (36.6 C)  Height: 5' 3 (1.6 m)  Weight: 160 lb 6.4 oz (72.8 kg)   SpO2: 98%  TempSrc: Oral  BMI (Calculated): 28.42    Body mass index is 28.41 kg/m.  Physical Exam Constitutional:      Appearance: Normal appearance.  HENT:     Head: Normocephalic and atraumatic.   Eyes:     Pupils: Pupils are equal, round, and reactive to light.    Cardiovascular:     Rate and Rhythm: Normal rate and regular rhythm.     Pulses: Normal pulses.     Heart sounds: Normal heart sounds.  Pulmonary:     Effort: Pulmonary effort is normal.     Breath sounds: Normal breath sounds.  Abdominal:     General: Abdomen is flat.     Palpations: Abdomen is soft.   Musculoskeletal:        General: Normal range of motion.     Cervical back: Normal range of motion.   Skin:    General: Skin is warm and dry.     Capillary Refill: Capillary refill takes less than 2 seconds.   Neurological:     General: No focal deficit present.     Mental Status: She is alert. Mental status is at baseline.   Psychiatric:        Mood and Affect: Mood normal.        Behavior: Behavior normal.       Assessment and Plan:   Annual physical exam Discussed lifestyle modifications and goals including plant based eating styles (such as: Mediterranean eating style), regular exercise (at least 150 min of moderate-intensity aerobic exercise per week, given AHA workout handout), get adequate sleep, and continue working with PCP towards meeting health goals to ensure healthy aging. Vitals at goal. -     Comprehensive metabolic panel with GFR -     CBC -     Lipid panel -     Hemoglobin A1c  Encounter for behavioral health screening As part of their intake evaluation, the patient was screened for depression, anxiety.  PHQ9 SCORE 0, GAD7 SCORE 0. Screening results negative for tested conditions. CTM.     This plan was discussed with the patient and questions were answered. There were no further concerns.  Follow up as indicated, or sooner should any new  problems arise, if conditions  worsen, or if they are otherwise concerned.   See patient instructions for additional information.  Hannah SHAUNNA Nett, MD  Family Medicine      Future Appointments  Date Time Provider Department Center  10/29/2023 10:00 AM Scheeler, Donnice PARAS, PA-C [facility] None  02/26/2024 10:20 AM Barnett Hannah SHAUNNA, MD [facility]

## 2023-10-21 NOTE — Patient Instructions (Addendum)
 For sleep:  We could try lunesta or doxepin in the future if you'd like  Continue using melatonin [facility]    [facility] Phone Number Practice Name  [place] [facility] [address] [place] [phone] LILA HERO CRISP DDS MS PA  [place] [facility] [address] [place] [phone] REENA JINNY SLOCUMB DDS PA III  [place] [facility] [address] [place] [phone] [facility]  [place] [facility] [address] [place] [phone] [facility]  [place] [facility] [address] [place] [phone] DEBBY LELON LING IV DMD PA  [place] [facility] [address] [place] [phone] [facility] FAMILY DENT[facility]  [place] [facility] [address] [place] [phone] [facility] Dentist [address] [place] [address] [place] [phone] [facility]  [place] [facility] [address] [place] [phone] [facility] G[facility]  [place] [facility] [address] [place] [phone] [facility] Oral Surgeon [address] JEWELL JAYSON JACOBS [phone] KERVIN MACK DMD MS [facility]  [place] [facility] [address] [place] [phone] [facility] PEDIATRIC [facility]ENTISTRY

## 2023-10-22 ENCOUNTER — Other Ambulatory Visit: Payer: Self-pay | Admitting: Pediatrics

## 2023-10-22 DIAGNOSIS — Z87891 Personal history of nicotine dependence: Secondary | ICD-10-CM

## 2023-10-22 LAB — COMPREHENSIVE METABOLIC PANEL WITH GFR
ALT: 17 IU/L (ref 0–32)
AST: 19 IU/L (ref 0–40)
Albumin: 4.4 g/dL (ref 3.8–4.8)
Alkaline Phosphatase: 100 IU/L (ref 44–121)
BUN/Creatinine Ratio: 16 (ref 12–28)
BUN: 12 mg/dL (ref 8–27)
Bilirubin Total: 0.4 mg/dL (ref 0.0–1.2)
CO2: 24 mmol/L (ref 20–29)
Calcium: 9.7 mg/dL (ref 8.7–10.3)
Chloride: 103 mmol/L (ref 96–106)
Creatinine, Ser: 0.76 mg/dL (ref 0.57–1.00)
Globulin, Total: 2.6 g/dL (ref 1.5–4.5)
Glucose: 79 mg/dL (ref 70–99)
Potassium: 3.8 mmol/L (ref 3.5–5.2)
Sodium: 143 mmol/L (ref 134–144)
Total Protein: 7 g/dL (ref 6.0–8.5)
eGFR: 83 mL/min/{1.73_m2} (ref >59)

## 2023-10-22 LAB — LIPID PANEL
Chol/HDL Ratio: 2.8 ratio (ref 0.0–4.4)
Cholesterol, Total: 176 mg/dL (ref 100–199)
HDL: 63 mg/dL (ref >39)
LDL Chol Calc (NIH): 87 mg/dL (ref 0–99)
Triglycerides: 155 mg/dL — ABNORMAL HIGH (ref 0–149)
VLDL Cholesterol Cal: 26 mg/dL (ref 5–40)

## 2023-10-22 LAB — HEMOGLOBIN A1C
Est. average glucose Bld gHb Est-mCnc: 114 mg/dL
Hgb A1c MFr Bld: 5.6 % (ref 4.8–5.6)

## 2023-10-22 NOTE — Progress Notes (Signed)
 Lung ca screening requested.  Hadassah SHAUNNA Nett, MD

## 2023-10-27 ENCOUNTER — Ambulatory Visit: Payer: Self-pay | Admitting: Pediatrics

## 2023-10-29 ENCOUNTER — Encounter: Payer: Self-pay | Admitting: Surgical

## 2023-10-29 ENCOUNTER — Ambulatory Visit: Payer: 59 | Admitting: Surgical

## 2023-10-29 DIAGNOSIS — Z9889 Other specified postprocedural states: Secondary | ICD-10-CM

## 2023-10-29 DIAGNOSIS — N6459 Other signs and symptoms in breast: Secondary | ICD-10-CM

## 2023-10-29 NOTE — Progress Notes (Addendum)
   Referring Provider Herold Hadassah SQUIBB, MD [address] [place],  [place] [address]   CC: No chief complaint on file.     Hannah Barnett is an 73 y.o. female.  HPI: Patient is a very pleasant 73 year old female here for follow-up after bilateral breast reduction with Dr. Waddell on 11/05/2022.  Surgery was complicated by skin loss along the inferior portion of both breasts.  Wounds have completely reepithelialized and pigmentation is beginning to return, however still has some residual hypopigmentation.  She did have a follow-up mammogram on 08/11/2023 which was BI-RADS 3, probably benign.  Impression: 1. Probably benign 5.1 cm region of fat necrosis in the upper outer quadrant of the right breast at 11 o'clock 6 CMFN is unchanged from prior examination from 05/15/2023. No additional suspicious findings in the right breast. 2. No findings suspicious for left breast malignancy.  She is scheduled for 53-month diagnostic right mammogram and ultrasound.  She is bothered by the hypopigmentation.  There has been some improvement.  Review of Systems General: No fevers or chills  Physical Exam    10/21/2023   10:45 AM 09/11/2023   10:59 AM 06/17/2023   10:28 AM  Vitals with BMI  Height 5' 3 5' 3 5' 3  Weight 160 lbs 6 oz 156 lbs 3 oz 154 lbs  BMI 28.42 27.68 27.29  Systolic 120 109 875  Diastolic 74 66 84  Pulse 78 89 72    General:  No acute distress,  Alert and oriented, Non-Toxic, Normal speech and affect Breast: Bilateral breast incisions are intact and well-healed.  Hypopigmentation is noted from breast wounds.  Firmness is noted at the 11 o'clock position on the right breast from the nipple.  Consistent with imaging.  No overlying skin changes.  Assessment/Plan Discussed with patient light massage to bilateral breast scarring, recommend massaging with Aquaphor or cocoa butter.  Discussed light massage may help soften scar.  We did discuss that tattooing is possible for hypopigmentation,  however tattooing within the scar sometimes can be unpredictable and may not have good take of the pigment due to the scarring.  I would like her to continue with light massage for the next few months, follow-up in November for reevaluation.  If she has had minimal improvement, we can further discuss tattooing.  We would need to submit this to insurance.  Pictures were obtained of the patient and placed in the chart with the patient's or guardian's permission.   Donnice JINNY Farren Nelles 11/12/2023, 11:21 AM

## 2023-11-06 NOTE — Telephone Encounter (Signed)
 Can we please fax an order to [facility]?

## 2023-11-09 ENCOUNTER — Encounter: Payer: Self-pay | Admitting: Plastic Surgery

## 2023-11-12 ENCOUNTER — Telehealth: Payer: Self-pay

## 2023-11-12 NOTE — Telephone Encounter (Signed)
 I called the patient in regards to her myChart messages. She has concerns about the size of her breasts and stated  some ongoing wounds. I offered her an appointment this week to be seen, but she is not able to come due to transportation. I will have the front desk call her and figure out a time that works for her next week to come in and see Dr. Waddell.

## 2023-11-13 ENCOUNTER — Other Ambulatory Visit: Payer: Self-pay | Admitting: Pediatrics

## 2023-11-13 DIAGNOSIS — Z122 Encounter for screening for malignant neoplasm of respiratory organs: Secondary | ICD-10-CM

## 2023-11-13 NOTE — Progress Notes (Unsigned)
 Lung cancer screening external imaging order placed.  Prefers [facility] in [place].   Hadassah SHAUNNA Nett, MD

## 2023-11-20 ENCOUNTER — Ambulatory Visit: Admitting: Plastic Surgery

## 2023-11-24 ENCOUNTER — Encounter: Payer: Self-pay | Admitting: Plastic Surgery

## 2023-11-24 ENCOUNTER — Ambulatory Visit (INDEPENDENT_AMBULATORY_CARE_PROVIDER_SITE_OTHER): Admitting: Plastic Surgery

## 2023-11-24 VITALS — BP 145/77 | HR 84 | Ht 63.0 in | Wt 160.6 lb

## 2023-11-24 DIAGNOSIS — N62 Hypertrophy of breast: Secondary | ICD-10-CM

## 2023-11-24 DIAGNOSIS — Z9889 Other specified postprocedural states: Secondary | ICD-10-CM

## 2023-11-24 DIAGNOSIS — N6459 Other signs and symptoms in breast: Secondary | ICD-10-CM | POA: Diagnosis not present

## 2023-11-24 NOTE — Progress Notes (Signed)
 Hannah Barnett returns today for evaluation of her breast.  Patient did have a bilateral breast reduction which was complicated by skin loss on the inferior portion of the breasts.  She now states that she does not feel that her breast size has changed with her breast reduction.  We reviewed her preoperative postoperative pictures and she sees that there is a significant difference between pre and postop.  I have suggested that she might wish to have a professional fitting for a bra.  We did discuss the scars on the inferior portion of the breast.  They are not ready for any type of revision.  It may be at least about a year before they soften enough for revisional surgery.  She will follow-up with me after the beginning of the year.

## 2023-11-26 NOTE — Telephone Encounter (Signed)
 completed

## 2023-12-23 ENCOUNTER — Other Ambulatory Visit: Payer: Self-pay | Admitting: Acute Care

## 2023-12-23 DIAGNOSIS — Z122 Encounter for screening for malignant neoplasm of respiratory organs: Secondary | ICD-10-CM

## 2024-01-13 ENCOUNTER — Ambulatory Visit
Admission: RE | Admit: 2024-01-13 | Discharge: 2024-01-13 | Disposition: A | Discharge status: Home / Self Care | Source: Ambulatory Visit | Attending: Acute Care | Admitting: Acute Care

## 2024-01-13 DIAGNOSIS — Z122 Encounter for screening for malignant neoplasm of respiratory organs: Secondary | ICD-10-CM | POA: Insufficient documentation

## 2024-01-13 DIAGNOSIS — J439 Emphysema, unspecified: Secondary | ICD-10-CM | POA: Insufficient documentation

## 2024-01-13 DIAGNOSIS — I2510 Atherosclerotic heart disease of native coronary artery without angina pectoris: Secondary | ICD-10-CM | POA: Insufficient documentation

## 2024-01-13 DIAGNOSIS — Z87891 Personal history of nicotine dependence: Secondary | ICD-10-CM | POA: Diagnosis not present

## 2024-01-13 DIAGNOSIS — I700 Atherosclerosis of aorta: Secondary | ICD-10-CM | POA: Insufficient documentation

## 2024-01-21 ENCOUNTER — Other Ambulatory Visit: Payer: Self-pay | Admitting: Acute Care

## 2024-01-21 DIAGNOSIS — Z122 Encounter for screening for malignant neoplasm of respiratory organs: Secondary | ICD-10-CM

## 2024-01-21 DIAGNOSIS — Z87891 Personal history of nicotine dependence: Secondary | ICD-10-CM

## 2024-01-29 ENCOUNTER — Telehealth: Payer: Self-pay | Admitting: Family Medicine

## 2024-01-29 NOTE — Telephone Encounter (Signed)
 error

## 2024-02-04 ENCOUNTER — Telehealth: Payer: Self-pay

## 2024-02-04 NOTE — Telephone Encounter (Signed)
 Called patient to discuss that she is overdue for her annual medicare wellness and at this time se prefers to think about scheduling at this point rather than actually scheduling.

## 2024-02-24 ENCOUNTER — Ambulatory Visit

## 2024-02-24 ENCOUNTER — Other Ambulatory Visit: Payer: Self-pay | Admitting: Plastic Surgery

## 2024-02-24 ENCOUNTER — Ambulatory Visit
Admission: RE | Admit: 2024-02-24 | Discharge: 2024-02-24 | Disposition: A | Discharge status: Home / Self Care | Source: Ambulatory Visit | Attending: Plastic Surgery | Admitting: Plastic Surgery

## 2024-02-24 DIAGNOSIS — R928 Other abnormal and inconclusive findings on diagnostic imaging of breast: Secondary | ICD-10-CM

## 2024-02-24 DIAGNOSIS — N6310 Unspecified lump in the right breast, unspecified quadrant: Secondary | ICD-10-CM

## 2024-02-26 ENCOUNTER — Ambulatory Visit: Admitting: Pediatrics

## 2024-02-27 ENCOUNTER — Encounter: Payer: Self-pay | Admitting: Pediatrics

## 2024-02-27 ENCOUNTER — Ambulatory Visit (INDEPENDENT_AMBULATORY_CARE_PROVIDER_SITE_OTHER): Admitting: Pediatrics

## 2024-02-27 VITALS — BP 125/78 | HR 98 | Temp 98.0°F | Ht 63.0 in | Wt 162.0 lb

## 2024-02-27 DIAGNOSIS — Z0000 Encounter for general adult medical examination without abnormal findings: Secondary | ICD-10-CM

## 2024-02-27 DIAGNOSIS — Z23 Encounter for immunization: Secondary | ICD-10-CM | POA: Diagnosis not present

## 2024-02-27 DIAGNOSIS — E7800 Pure hypercholesterolemia, unspecified: Secondary | ICD-10-CM | POA: Diagnosis not present

## 2024-02-27 LAB — CBC
Hematocrit: 36.5 % (ref 34.0–46.6)
Hemoglobin: 13.5 g/dL (ref 11.1–15.9)
MCH: 28.8 pg (ref 26.6–33.0)
MCHC: 37 g/dL — ABNORMAL HIGH (ref 31.5–35.7)
MCV: 78 fL — ABNORMAL LOW (ref 79–97)
Platelets: 433 x10E3/uL (ref 150–450)
RBC: 4.69 x10E6/uL (ref 3.77–5.28)
RDW: 15.9 % — ABNORMAL HIGH (ref 11.7–15.4)
WBC: 6.6 x10E3/uL (ref 3.4–10.8)

## 2024-02-27 NOTE — Patient Instructions (Signed)
 [name],  Thank you for taking the time for your Medicare Wellness Visit. I appreciate your continued commitment to your health goals. Please review the care plan we discussed, and feel free to reach out if I can assist you further.  Please note that Annual Wellness Visits do not include a physical exam. Some assessments may be limited, especially if the visit was conducted virtually. If needed, we may recommend an in-person follow-up with your provider.  Ongoing Care Seeing your primary care provider every 3 to 6 months helps us  monitor your health and provide consistent, personalized care.   Referrals If a referral was made during today's visit and you haven't received any updates within two weeks, please contact the referred provider directly to check on the status.  Recommended Screenings:  Health Maintenance  Topic Date Due   Zoster (Shingles) Vaccine (1 of 2) Never done   Flu Shot  Never done   COVID-19 Vaccine (6 - 2025-26 season) 12/22/2023   Medicare Annual Wellness Visit  01/03/2024   Pneumococcal Vaccine for age over 30 (1 of 1 - PCV) 10/20/2024*   Breast Cancer Screening  08/10/2025   Colon Cancer Screening  03/06/2031   DEXA scan (bone density measurement)  Completed   Hepatitis C Screening  Completed   Meningitis B Vaccine  Aged Out   Screening for Lung Cancer  Discontinued   DTaP/Tdap/Td vaccine  Discontinued  *Topic was postponed. The date shown is not the original due date.       02/27/2024   10:26 AM  Advanced Directives  Does Patient Have a Medical Advance Directive? No  Would patient like information on creating a medical advance directive? No - Patient declined    Vision: Annual vision screenings are recommended for early detection of glaucoma, cataracts, and diabetic retinopathy. These exams can also reveal signs of chronic conditions such as diabetes and high blood pressure.  Dental: Annual dental screenings help detect early signs of oral cancer,  gum disease, and other conditions linked to overall health, including heart disease and diabetes.  Please see the attached documents for additional preventive care recommendations.

## 2024-02-27 NOTE — Progress Notes (Signed)
 Subjective:   Hannah Barnett is a 73 y.o. female who presents for a Medicare Annual Wellness Visit.  Allergies (verified) Atorvastatin and Lisinopril   History: Past Medical History:  Diagnosis Date   Hyperlipidemia    Hypertension    Past Surgical History:  Procedure Laterality Date   BREAST REDUCTION SURGERY Bilateral 11/05/2022   Procedure: BILATERAL MAMMARY REDUCTION  (BREAST);  Surgeon: Waddell Leonce NOVAK, MD;  Location: [facility];  Service: Plastics;  Laterality: Bilateral;   CATARACT EXTRACTION Bilateral    REDUCTION MAMMAPLASTY Bilateral 10/2022   Family History  Problem Relation Age of Onset   Dementia Mother    Lung cancer Father    COPD Brother    Hearing loss Brother    Hypertension Brother    Stroke Brother    Social History   Occupational History   Not on file  Tobacco Use   Smoking status: Former    Types: Cigarettes   Smokeless tobacco: Never  Vaping Use   Vaping status: Never Used  Substance and Sexual Activity   Alcohol use: Not Currently   Drug use: Never   Sexual activity: Not Currently   Tobacco Counseling Counseling given: Not Answered  SDOH Screenings   Food Insecurity: No Food Insecurity (02/23/2024)  Housing: Unknown (02/23/2024)  Transportation Needs: No Transportation Needs (02/23/2024)  Utilities: Not At Risk (01/03/2023)  Alcohol Screen: Low Risk  (05/06/2023)  Depression (PHQ2-9): Low Risk  (02/27/2024)  Financial Resource Strain: Low Risk  (02/23/2024)  Physical Activity: Insufficiently Active (02/23/2024)  Social Connections: Socially Isolated (02/23/2024)  Stress: No Stress Concern Present (02/23/2024)  Tobacco Use: Medium Risk (02/27/2024)  Health Literacy: Adequate Health Literacy (05/06/2023)   Depression Screen    02/27/2024   10:45 AM 10/21/2023   10:56 AM 09/11/2023   11:05 AM 06/17/2023   10:30 AM 05/06/2023   11:28 AM 04/18/2023    2:58 PM 03/14/2023   10:03 AM  PHQ 2/9 Scores  PHQ - 2 Score 0 0 0 0 0 0  0  PHQ- 9 Score 1 0  2  0  1  2  0      Data saved with a previous flowsheet row definition     Goals Addressed   None    Visit info / Clinical Intake: Medicare Wellness Visit Type:: Welcome to [employer]IC[employer]) Medicare Wellness Visit Mode:: Video Interpreter Needed?: No Pre-visit prep was completed: no AWV questionnaire completed by patient prior to visit?: no Living arrangements:: (!) lives alone Patient's Overall Health Status Rating: good Typical amount of pain: none Does pain affect daily life?: no Are you currently prescribed opioids?: no  Dietary Habits and Nutritional Risks How many meals a day?: 2 Eats fruit and vegetables daily?: yes Most meals are obtained by: preparing own meals Diabetic:: no  Functional Status Activities of Daily Living (to include ambulation/medication): Independent Ambulation: Independent Medication Administration: Independent Home Management: Independent Manage your own finances?: yes Primary transportation is: driving Concerns about hearing?: no Uses hearing aids?: no Hear whispered voice?: yes  Fall Screening Falls in the past year?: 0 Number of falls in past year: 0 Was there an injury with Fall?: 0 Fall Risk Category Calculator: 0 Patient Fall Risk Level: Low Fall Risk  Fall Risk Patient at Risk for Falls Due to: No Fall Risks Fall risk Follow up: Falls evaluation completed  Home and Transportation Safety: All rugs have non-skid backing?: yes All stairs or steps have railings?: yes Grab bars in the  bathtub or shower?: yes Have non-skid surface in bathtub or shower?: yes Good home lighting?: yes Regular seat belt use?: yes Hospital stays in the last year:: no  Cognitive Assessment Difficulty concentrating, remembering, or making decisions? : no Will 6CIT or Mini Cog be Completed: no 6CIT or Mini Cog Declined: patient alert, oriented, able to answer questions appropriately and recall recent events  Advance Directives  (For Healthcare) Does Patient Have a Medical Advance Directive?: No Would patient like information on creating a medical advance directive?: No - Patient declined  Reviewed/Updated  Reviewed/Updated: Medical History; Surgical History; All; Family History; Medications; Allergies; Care Teams; Patient Goals        Objective:    Today's Vitals   02/27/24 1026 02/27/24 1033  BP:  125/78  Pulse:  98  Temp:  98 F (36.7 C)  TempSrc:  Oral  SpO2:  97%  Weight:  162 lb (73.5 kg)  Height:  5' 3 (1.6 m)  PainSc: 0-No pain 0-No pain   Body mass index is 28.7 kg/m.  Current Medications (verified) Outpatient Encounter Medications as of 02/27/2024  Medication Sig   Cholecalciferol 50 MCG (2000 UT) CAPS Take by mouth.   Multiple Vitamin (MULTIVITAMIN WITH MINERALS) TABS tablet Take 1 tablet by mouth daily.   rosuvastatin  (CRESTOR ) 10 MG tablet Take 1 tablet (10 mg total) by mouth at bedtime.   [DISCONTINUED] traZODone  (DESYREL ) 50 MG tablet Take 0.5-1 tablets (25-50 mg total) by mouth at bedtime as needed for sleep. (Patient not taking: Reported on 11/24/2023)   [DISCONTINUED] triamcinolone  (NASACORT ) 55 MCG/ACT AERO nasal inhaler SMARTSIG:Both Nares   No facility-administered encounter medications on file as of 02/27/2024.   Hearing/Vision screen No results found. Immunizations and Health Maintenance Health Maintenance  Topic Date Due   Zoster Vaccines- Shingrix (1 of 2) Never done   Influenza Vaccine  07/20/2024 (Originally 11/21/2023)   Pneumococcal Vaccine: 50+ Years (1 of 1 - PCV) 10/20/2024 (Originally 09/27/2001)   COVID-19 Vaccine (7 - Moderna risk 2025-26 season) 08/26/2024   Medicare Annual Wellness (AWV)  02/26/2025   Mammogram  08/10/2025   Colonoscopy  03/06/2031   DEXA SCAN  Completed   Hepatitis C Screening  Completed   Meningococcal B Vaccine  Aged Out   Lung Cancer Screening  Discontinued   DTaP/Tdap/Td  Discontinued        Assessment/Plan:  This is a routine  wellness examination for [name].  Patient Care Team: Herold Hadassah SQUIBB, MD as PCP - General (Family Medicine)  I have personally reviewed and noted the following in the patient's chart:   Medical and social history Use of alcohol, tobacco or illicit drugs  Current medications and supplements including opioid prescriptions. Functional ability and status Nutritional status Physical activity Advanced directives List of other physicians Hospitalizations, surgeries, and ER visits in previous 12 months Vitals Screenings to include cognitive, depression, and falls Referrals and appointments  Orders Placed This Encounter  Procedures   Pfizer Comirnaty Covid -19 Vaccine 63yrs and older   HgB A1c   VITAMIN D  25 Hydroxy (Vit-D Deficiency, Fractures)   Comp Met (CMET)   CBC   Lipid Profile   Lipid panel   In addition, I have reviewed and discussed with patient certain preventive protocols, quality metrics, and best practice recommendations. A written personalized care plan for preventive services as well as general preventive health recommendations were provided to patient.   Hadassah SQUIBB Herold, MD   03/03/2024   Return in about 6 months (around 08/26/2024).  After Visit Summary: (In Person-Printed) AVS printed and given to the patient

## 2024-02-27 NOTE — Progress Notes (Deleted)
   Office Visit  BP 125/78   Pulse 98   Temp 98 F (36.7 C) (Oral)   Ht 5' 3 (1.6 m)   Wt 162 lb (73.5 kg)   SpO2 97%   BMI 28.70 kg/m    Subjective:    Patient ID: Hannah Barnett, female    DOB: [birth year], 73 y.o.   MRN: [id number]  HPI: Hannah Barnett is a 73 y.o. female  Chief Complaint  Patient presents with   Medicare Wellness    Discussed the use of AI scribe software for clinical note transcription with the patient, who gave verbal consent to proceed.  History of Present Illness     Relevant past medical, surgical, family and social history reviewed and updated as indicated. Interim medical history since our last visit reviewed. Allergies and medications reviewed and updated.  ROS per HPI unless specifically indicated above     Objective:    BP 125/78   Pulse 98   Temp 98 F (36.7 C) (Oral)   Ht 5' 3 (1.6 m)   Wt 162 lb (73.5 kg)   SpO2 97%   BMI 28.70 kg/m   Wt Readings from Last 3 Encounters:  02/27/24 162 lb (73.5 kg)  11/24/23 160 lb 9.6 oz (72.8 kg)  10/21/23 160 lb 6.4 oz (72.8 kg)     Physical Exam      10/21/2023   10:56 AM 09/11/2023   11:05 AM 06/17/2023   10:30 AM 05/06/2023   11:28 AM 04/18/2023    2:58 PM  Depression screen PHQ 2/9  Decreased Interest 0 0 0 0 0  Down, Depressed, Hopeless 0 0 0 0 0  PHQ - 2 Score 0 0 0 0 0  Altered sleeping 0 2 0 1 2  Tired, decreased energy 0 0 0 0 0  Change in appetite 0 0 0 0 0  Feeling bad or failure about yourself  0 0 0 0 0  Trouble concentrating 0 0 0 0 0  Moving slowly or fidgety/restless 0 0 0 0 0  Suicidal thoughts 0 0 0 0 0  PHQ-9 Score 0  2  0  1  2   Difficult doing work/chores Not difficult at all Not difficult at all Not difficult at all Not difficult at all Not difficult at all     Data saved with a previous flowsheet row definition       10/21/2023   10:56 AM 09/11/2023   11:06 AM 06/17/2023   10:30 AM 05/06/2023   11:29 AM  GAD 7 : Generalized Anxiety Score  Nervous,  Anxious, on Edge 0 0 0 0  Control/stop worrying 0 0 0 0  Worry too much - different things 0 0 0 0  Trouble relaxing 0 0 0 0  Restless 0 0 0 0  Easily annoyed or irritable 0 0 0 0  Afraid - awful might happen 0 0 0 0  Total GAD 7 Score 0 0 0 0  Anxiety Difficulty Not difficult at all Not difficult at all Not difficult at all Not difficult at all       Assessment & Plan:  Assessment & Plan   Need for vaccination -     Pfizer Comirnaty Covid-19 Vaccine 45yrs & older     Assessment and Plan Assessment & Plan      Follow up plan: Return in about 6 months (around 08/26/2024).  Hadassah SHAUNNA Nett, MD

## 2024-02-28 LAB — COMPREHENSIVE METABOLIC PANEL WITH GFR
ALT: 12 IU/L (ref 0–32)
AST: 16 IU/L (ref 0–40)
Albumin: 4.2 g/dL (ref 3.8–4.8)
Alkaline Phosphatase: 92 IU/L (ref 49–135)
BUN/Creatinine Ratio: 11 — ABNORMAL LOW (ref 12–28)
BUN: 9 mg/dL (ref 8–27)
Bilirubin Total: 0.4 mg/dL (ref 0.0–1.2)
CO2: 23 mmol/L (ref 20–29)
Calcium: 9.8 mg/dL (ref 8.7–10.3)
Chloride: 105 mmol/L (ref 96–106)
Creatinine, Ser: 0.82 mg/dL (ref 0.57–1.00)
Globulin, Total: 2.7 g/dL (ref 1.5–4.5)
Glucose: 79 mg/dL (ref 70–99)
Potassium: 4.2 mmol/L (ref 3.5–5.2)
Sodium: 143 mmol/L (ref 134–144)
Total Protein: 6.9 g/dL (ref 6.0–8.5)
eGFR: 76 mL/min/1.73 (ref >59)

## 2024-02-28 LAB — LIPID PANEL
Chol/HDL Ratio: 3.1 ratio (ref 0.0–4.4)
Cholesterol, Total: 177 mg/dL (ref 100–199)
HDL: 58 mg/dL (ref >39)
LDL Chol Calc (NIH): 94 mg/dL (ref 0–99)
Triglycerides: 144 mg/dL (ref 0–149)
VLDL Cholesterol Cal: 25 mg/dL (ref 5–40)

## 2024-02-28 LAB — HEMOGLOBIN A1C
Est. average glucose Bld gHb Est-mCnc: 114 mg/dL
Hgb A1c MFr Bld: 5.6 % (ref 4.8–5.6)

## 2024-02-28 LAB — VITAMIN D 25 HYDROXY (VIT D DEFICIENCY, FRACTURES): Vit D, 25-Hydroxy: 50.3 ng/mL (ref 30.0–100.0)

## 2024-03-01 ENCOUNTER — Ambulatory Visit: Payer: Self-pay | Admitting: Pediatrics

## 2024-03-01 ENCOUNTER — Ambulatory Visit: Admitting: Plastic Surgery

## 2024-03-02 ENCOUNTER — Ambulatory Visit: Admitting: Surgical

## 2024-03-03 ENCOUNTER — Encounter: Payer: Self-pay | Admitting: Pediatrics

## 2024-03-04 ENCOUNTER — Ambulatory Visit: Admitting: Plastic Surgery

## 2024-03-11 ENCOUNTER — Ambulatory Visit (INDEPENDENT_AMBULATORY_CARE_PROVIDER_SITE_OTHER): Admitting: Plastic Surgery

## 2024-03-11 ENCOUNTER — Encounter: Payer: Self-pay | Admitting: Plastic Surgery

## 2024-03-11 VITALS — BP 143/82 | HR 84 | Ht 63.0 in | Wt 164.4 lb

## 2024-03-11 DIAGNOSIS — N6459 Other signs and symptoms in breast: Secondary | ICD-10-CM

## 2024-03-11 DIAGNOSIS — Z9889 Other specified postprocedural states: Secondary | ICD-10-CM

## 2024-03-11 NOTE — Progress Notes (Signed)
 Ms. Kelch returns today for follow-up.  She underwent a bilateral breast reduction in July 2024.  This was complicated by wound breakdown on the inferior portion of the breast.  This has since healed.  She does have occasional discomfort and she does have an area of firm tissue within the right breast.  She underwent bilateral mammogram approximately 2 weeks ago the mass has remained stable and appears to be fat necrosis from her breast reduction.  The mammogram was read as BI-RADS 3 and she will have a follow-up in April.  I have asked her to continue scar massage continue moisturizing her skin.  She will follow-up with me at the beginning of May after her mammogram is complete.  We will discuss whether she would like to undergo any type of scar revision at that time.  Will need a nicotine level prior to any surgery.

## 2024-03-12 ENCOUNTER — Other Ambulatory Visit: Payer: Self-pay | Admitting: Pediatrics

## 2024-03-12 DIAGNOSIS — E7800 Pure hypercholesterolemia, unspecified: Secondary | ICD-10-CM

## 2024-03-12 DIAGNOSIS — Z789 Other specified health status: Secondary | ICD-10-CM

## 2024-03-12 MED ORDER — EZETIMIBE 10 MG PO TABS: 10.0000 mg | ORAL_TABLET | Freq: Every day | ORAL | 3 refills | Status: AC

## 2024-03-12 NOTE — Progress Notes (Signed)
 Pt messaged she would like alternative to statin. Has had joint and muscle aches, constipation and insomnia she attributes to the medication. I explained constipation and insomnia unlikely from medication. If ongoing issue, recommend she scheduled a visit.  Sent zetia  10mg . Can d/c statin.  Hannah SHAUNNA Nett, MD

## 2024-05-02 ENCOUNTER — Encounter: Payer: Self-pay | Admitting: Nurse Practitioner

## 2024-05-03 ENCOUNTER — Ambulatory Visit: Payer: Self-pay

## 2024-05-03 NOTE — Telephone Encounter (Signed)
 FYI Only or Action Required?: Action required by provider: clinical question for provider.  Patient was last seen in primary care on 02/27/2024 by Herold Hadassah SQUIBB, MD.  Called Nurse Triage reporting Advice Only.  Symptoms began several days ago.  Interventions attempted: Nothing.  Symptoms are: unchanged.  Triage Disposition: Information or Advice Only Call, Call EMS 911 Now  Patient/caregiver understands and will follow disposition?: No, wishes to speak with PCP  Copied from CRM #[id number]. Topic: Clinical - Red Word Triage >> May 03, 2024 12:06 PM Tiffini S wrote: Red Word that prompted transfer to Nurse Triage: Patient had a stomach viral- it is 24 hours and she feels better- her brother is there at her location now and wants to know if it is to be around people- offered pink words/ patient refused stating that she need assistance now Reason for Disposition  Nursing judgment  General information question, no triage required and triager able to answer question  Answer Assessment - Initial Assessment Questions 1. REASON FOR CALL: What is the main reason for your call? or How can I best help you?    Patient says she had a stomach virus. Started feeling better last night. Brother is at her house now and wants to know if it is safe for him to be there. I told patient That is was probably best if he was not and that generally, we consider you contagious for 24 hours after symptoms resolved. Patient would like a call back from doctor about this. Patient repeatedly asking if she can be around brother but when I told her it was best not to, she became upset.  Protocols used: No Guideline or Reference Available-A-AH, Information Only Call - No Triage-A-AH

## 2024-05-03 NOTE — Telephone Encounter (Signed)
 FYI Only or Action Required?: FYI only for provider: appointment scheduled on 1/16.  Patient was last seen in primary care on 02/27/2024 by Herold Hadassah SQUIBB, MD.  Called Nurse Triage reporting Stool Color Change.  Symptoms began several days ago.  Interventions attempted: Nothing.  Symptoms are: completely resolved.  Triage Disposition: See PCP When Office is Open (Within 3 Days)  Patient/caregiver understands and will follow disposition?: Yes  Copied from CRM #[id number]. Topic: Clinical - Red Word Triage >> May 03, 2024 11:41 AM Delon HERO wrote: Red Word that prompted transfer to Nurse Triage: Patient is calling to report blood in stool. Advised to scheduled appointment. Reason for Disposition  MILD rectal bleeding (e.g., more than just a few drops or streaks)  Answer Assessment - Initial Assessment Questions 1. COLOR: What color is your stool? Is that color in part or all of the stool? (e.g., black, clay-colored, green, red)      Bright red blood in stool 2-3 times, since resolved  2. ONSET: When did you first notice this color change?     X Thursday night  3. CAUSE: Have you eaten any food or taken any medicine of this color? Note: See listing in Background Information section.      Denies  4. OTHER SYMPTOMS: Do you have any other symptoms? (e.g., abdomen pain, diarrhea, fever, yellow eyes or skin).     Diarrhea  Answer Assessment - Initial Assessment Questions .  Protocols used: Stools - Unusual Color-A-AH, Rectal Bleeding-A-AH

## 2024-05-06 ENCOUNTER — Ambulatory Visit (INDEPENDENT_AMBULATORY_CARE_PROVIDER_SITE_OTHER): Admitting: Nurse Practitioner

## 2024-05-06 ENCOUNTER — Encounter: Payer: Self-pay | Admitting: Nurse Practitioner

## 2024-05-06 VITALS — BP 122/76 | HR 96 | Temp 97.7°F | Ht 62.99 in | Wt 158.0 lb

## 2024-05-06 DIAGNOSIS — I700 Atherosclerosis of aorta: Secondary | ICD-10-CM | POA: Diagnosis not present

## 2024-05-06 DIAGNOSIS — J439 Emphysema, unspecified: Secondary | ICD-10-CM | POA: Insufficient documentation

## 2024-05-06 DIAGNOSIS — I2510 Atherosclerotic heart disease of native coronary artery without angina pectoris: Secondary | ICD-10-CM | POA: Insufficient documentation

## 2024-05-06 DIAGNOSIS — K921 Melena: Secondary | ICD-10-CM

## 2024-05-06 NOTE — Assessment & Plan Note (Signed)
 Continue with Zetia .  Not able to tolerate Rosuvastatin .

## 2024-05-06 NOTE — Assessment & Plan Note (Signed)
 Seen on CT Lung from 2025. Not able to tolerate Rosuvastatin .  Continue with Zetia .

## 2024-05-06 NOTE — Assessment & Plan Note (Signed)
 Noted on CT lung screening for September 2025.  Not currently on inhalers. Will address symptoms at visit in May.

## 2024-05-06 NOTE — Progress Notes (Signed)
 "  BP 122/76   Pulse 96   Temp 97.7 F (36.5 C) (Oral)   Ht 5' 2.99 (1.6 m)   Wt 158 lb (71.7 kg)   SpO2 98%   BMI 28.00 kg/m    Subjective:    Patient ID: Hannah Barnett, female    DOB: 1951-02-03, 74 y.o.   MRN: [id number]  HPI: Hannah Barnett is a 74 y.o. female  Chief Complaint  Patient presents with   Diarrhea    Blood in stool a few days ago. Patient wants to know what vitamins she can take with the Zetia .   Patient states she believes she had a stomach bug that lasted about 24 hours.  States she saw blood on the toilet paper and some in the toilet. She does have hemorrhoids which were irritated during her illness.  Symptoms have resolved. She denies any blood in her stool today, abdominal pain, fever, or diarrhea.       Relevant past medical, surgical, family and social history reviewed and updated as indicated. Interim medical history since our last visit reviewed. Allergies and medications reviewed and updated.  Review of Systems  Constitutional:  Negative for fever.  Gastrointestinal:  Negative for abdominal pain, blood in stool and diarrhea.    Per HPI unless specifically indicated above     Objective:    BP 122/76   Pulse 96   Temp 97.7 F (36.5 C) (Oral)   Ht 5' 2.99 (1.6 m)   Wt 158 lb (71.7 kg)   SpO2 98%   BMI 28.00 kg/m   Wt Readings from Last 3 Encounters:  05/06/24 158 lb (71.7 kg)  03/11/24 164 lb 6.4 oz (74.6 kg)  02/27/24 162 lb (73.5 kg)    Physical Exam Vitals and nursing note reviewed.  Constitutional:      General: She is not in acute distress.    Appearance: Normal appearance. She is normal weight. She is not ill-appearing, toxic-appearing or diaphoretic.  HENT:     Head: Normocephalic.     Right Ear: External ear normal.     Left Ear: External ear normal.     Nose: Nose normal.     Mouth/Throat:     Mouth: Mucous membranes are moist.     Pharynx: Oropharynx is clear.  Eyes:     General:        Right eye: No discharge.         Left eye: No discharge.     Extraocular Movements: Extraocular movements intact.     Conjunctiva/sclera: Conjunctivae normal.     Pupils: Pupils are equal, round, and reactive to light.  Cardiovascular:     Rate and Rhythm: Normal rate and regular rhythm.     Heart sounds: No murmur heard. Pulmonary:     Effort: Pulmonary effort is normal. No respiratory distress.     Breath sounds: Normal breath sounds. No wheezing or rales.  Abdominal:     General: Abdomen is flat. Bowel sounds are normal. There is no distension.     Palpations: Abdomen is soft. There is no mass.     Tenderness: There is no abdominal tenderness. There is no right CVA tenderness, left CVA tenderness, guarding or rebound.     Hernia: No hernia is present.  Musculoskeletal:     Cervical back: Normal range of motion and neck supple.  Skin:    General: Skin is warm and dry.     Capillary Refill: Capillary refill takes less  than 2 seconds.  Neurological:     General: No focal deficit present.     Mental Status: She is alert and oriented to person, place, and time. Mental status is at baseline.  Psychiatric:        Mood and Affect: Mood normal.        Behavior: Behavior normal.        Thought Content: Thought content normal.        Judgment: Judgment normal.     Results for orders placed or performed in visit on 02/27/24  CBC   Collection Time: 02/27/24 10:56 AM  Result Value Ref Range   WBC 6.6 3.4 - 10.8 x10E3/uL   RBC 4.69 3.77 - 5.28 x10E6/uL   Hemoglobin 13.5 11.1 - 15.9 g/dL   Hematocrit 63.4 65.9 - 46.6 %   MCV 78 (L) 79 - 97 fL   MCH 28.8 26.6 - 33.0 pg   MCHC 37.0 (H) 31.5 - 35.7 g/dL   RDW 84.0 (H) 88.2 - 84.5 %   Platelets 433 150 - 450 x10E3/uL  HgB A1c   Collection Time: 02/27/24 10:57 AM  Result Value Ref Range   Hgb A1c MFr Bld 5.6 4.8 - 5.6 %   Est. average glucose Bld gHb Est-mCnc 114 mg/dL  VITAMIN D  25 Hydroxy (Vit-D Deficiency, Fractures)   Collection Time: 02/27/24 10:57 AM   Result Value Ref Range   Vit D, 25-Hydroxy 50.3 30.0 - 100.0 ng/mL  Comp Met (CMET)   Collection Time: 02/27/24 10:57 AM  Result Value Ref Range   Glucose 79 70 - 99 mg/dL   BUN 9 8 - 27 mg/dL   Creatinine, Ser 9.17 0.57 - 1.00 mg/dL   eGFR 76 >40 fO/fpw/8.26   BUN/Creatinine Ratio 11 (L) 12 - 28   Sodium 143 134 - 144 mmol/L   Potassium 4.2 3.5 - 5.2 mmol/L   Chloride 105 96 - 106 mmol/L   CO2 23 20 - 29 mmol/L   Calcium  9.8 8.7 - 10.3 mg/dL   Total Protein 6.9 6.0 - 8.5 g/dL   Albumin 4.2 3.8 - 4.8 g/dL   Globulin, Total 2.7 1.5 - 4.5 g/dL   Bilirubin Total 0.4 0.0 - 1.2 mg/dL   Alkaline Phosphatase 92 49 - 135 IU/L   AST 16 0 - 40 IU/L   ALT 12 0 - 32 IU/L  Lipid panel   Collection Time: 02/27/24 10:57 AM  Result Value Ref Range   Cholesterol, Total 177 100 - 199 mg/dL   Triglycerides 855 0 - 149 mg/dL   HDL 58 >60 mg/dL   VLDL Cholesterol Cal 25 5 - 40 mg/dL   LDL Chol Calc (NIH) 94 0 - 99 mg/dL   Chol/HDL Ratio 3.1 0.0 - 4.4 ratio      Assessment & Plan:   Problem List Items Addressed This Visit       Cardiovascular and Mediastinum   Aortic atherosclerosis   Seen on CT Lung from 2025. Not able to tolerate Rosuvastatin .  Continue with Zetia .       Coronary artery calcification   Continue with Zetia .  Not able to tolerate Rosuvastatin .        Respiratory   Emphysema, unspecified (HCC) - Primary   Noted on CT lung screening for September 2025.  Not currently on inhalers. Will address symptoms at visit in May.      Other Visit Diagnoses       Blood in stool  Suspect it was related to Viral Gastro and Hemorrhoids. Will obtain IFBOT to rule out blood in the stool. If positive will send to GI.   Relevant Orders   Fecal occult blood, imunochemical        Follow up plan: No follow-ups on file.      "

## 2024-05-06 NOTE — Telephone Encounter (Unsigned)
 Copied from CRM [id number]. Topic: Clinical - Medical Advice >> May 06, 2024  4:01 PM Fonda T wrote: Reason for CRM: Pt calling, reports she was seen in office today for a visit, and sees on mychart where there are labs ordered for fecal occult blood, and wants to know why she should go to [facility] to have this done.  Pt also reports this lab test or reason for this test was not discussed at her appt today.  Pt is requesting a follow up call to discuss further.  Pt can be reached at [phone]  Aware of follow up call.

## 2024-05-07 ENCOUNTER — Telehealth: Payer: Self-pay

## 2024-05-07 ENCOUNTER — Encounter: Payer: Self-pay | Admitting: Nurse Practitioner

## 2024-05-07 NOTE — Telephone Encounter (Signed)
 Contacted patient to discuss concerns and several mychart messages received from the patient. Patient states that she read somewhere on her mychart that she denied having blood in her stool. Patient states that she did not say this and that it was the while reason for the visit. Reviewed patients chart and office note and explained to the patient that Darice states that she denied blood in her stool during the visit and that the blood she saw was in the days prior to the appointment. Patient states that is not what she read in her mychart. I asked the patient where she saw this statement at in her mychart and she did not know exactly where. I apologized to the patient for the misunderstanding and again restated what Karen's note read. Also informed patient that the stool card she received at the lab before leaving yesterday was to check for blood in her stool. Encouraged patient to complete this and return it to our office for testing. Patient also sent a message stating that we still have Rosuvastatin  listed on her chart when she states she was no longer taking this at her appointment. I explained to the patient that we did take it off of her medication list for her. Patient states that it was still on her print out she got when leaving the appointment. I explained that the medical assistant more than likely marked the medication as not taking and Darice did not take it completely off of her chart until after her print out was done when she was closing her note. Also addressed patient's question regarding the vitamins she can take with Zetia . Patient specifically asked if she could take folic acid. Placed patient on hold and verbally asked Darice if she could take the folic acid with her Zetia . Darice stated that she could and I relayed this information to the patient. Asked the patient if she had any other questions or concerns she would like addressed. Patient stated no not at this time.

## 2024-05-09 ENCOUNTER — Encounter: Payer: Self-pay | Admitting: Nurse Practitioner

## 2024-05-13 LAB — FECAL OCCULT BLOOD, IMMUNOCHEMICAL: Fecal Occult Bld: NEGATIVE

## 2024-05-14 ENCOUNTER — Ambulatory Visit: Payer: Self-pay | Admitting: Nurse Practitioner

## 2024-08-13 ENCOUNTER — Encounter

## 2024-08-25 ENCOUNTER — Ambulatory Visit: Admitting: Plastic Surgery

## 2024-08-26 ENCOUNTER — Encounter: Admitting: Nurse Practitioner
